# Patient Record
Sex: Female | Born: 1960
Health system: Southern US, Community
[De-identification: ages and names within clinical notes are randomized; demographics above are authoritative.]

## PROBLEM LIST (undated history)

## (undated) DIAGNOSIS — R079 Chest pain, unspecified: Secondary | ICD-10-CM

## (undated) DIAGNOSIS — I10 Essential (primary) hypertension: Secondary | ICD-10-CM

## (undated) DIAGNOSIS — E669 Obesity, unspecified: Secondary | ICD-10-CM

## (undated) DIAGNOSIS — E559 Vitamin D deficiency, unspecified: Secondary | ICD-10-CM

## (undated) DIAGNOSIS — E785 Hyperlipidemia, unspecified: Secondary | ICD-10-CM

## (undated) DIAGNOSIS — G459 Transient cerebral ischemic attack, unspecified: Secondary | ICD-10-CM

## (undated) DIAGNOSIS — M2550 Pain in unspecified joint: Secondary | ICD-10-CM

## (undated) DIAGNOSIS — E119 Type 2 diabetes mellitus without complications: Secondary | ICD-10-CM

## (undated) HISTORY — DX: Obesity, unspecified: E66.9

## (undated) HISTORY — DX: Vitamin D deficiency, unspecified: E55.9

## (undated) HISTORY — DX: Transient cerebral ischemic attack, unspecified: G45.9

## (undated) HISTORY — DX: Pain in unspecified joint: M25.50

## (undated) HISTORY — DX: Chest pain, unspecified: R07.9

## (undated) HISTORY — PX: ABDOMINAL HYSTERECTOMY: SHX81

---

## 1997-08-11 ENCOUNTER — Other Ambulatory Visit: Admission: RE | Admit: 1997-08-11 | Discharge: 1997-08-11 | Payer: Self-pay | Admitting: Obstetrics & Gynecology

## 1998-09-27 ENCOUNTER — Other Ambulatory Visit: Admission: RE | Admit: 1998-09-27 | Discharge: 1998-09-27 | Payer: Self-pay | Admitting: *Deleted

## 2001-07-01 ENCOUNTER — Encounter (HOSPITAL_COMMUNITY): Admission: RE | Admit: 2001-07-01 | Discharge: 2001-09-29 | Payer: Self-pay | Admitting: Family Medicine

## 2001-07-26 ENCOUNTER — Other Ambulatory Visit: Admission: RE | Admit: 2001-07-26 | Discharge: 2001-07-26 | Payer: Self-pay | Admitting: Obstetrics

## 2001-08-04 ENCOUNTER — Ambulatory Visit (HOSPITAL_COMMUNITY): Admission: RE | Admit: 2001-08-04 | Discharge: 2001-08-04 | Payer: Self-pay | Admitting: Obstetrics

## 2001-08-04 ENCOUNTER — Encounter: Payer: Self-pay | Admitting: Obstetrics

## 2001-09-07 ENCOUNTER — Encounter (INDEPENDENT_AMBULATORY_CARE_PROVIDER_SITE_OTHER): Payer: Self-pay

## 2001-09-07 ENCOUNTER — Inpatient Hospital Stay (HOSPITAL_COMMUNITY): Admission: RE | Admit: 2001-09-07 | Discharge: 2001-09-10 | Payer: Self-pay | Admitting: Obstetrics

## 2002-11-08 ENCOUNTER — Ambulatory Visit (HOSPITAL_COMMUNITY): Admission: RE | Admit: 2002-11-08 | Discharge: 2002-11-08 | Payer: Self-pay | Admitting: Obstetrics

## 2002-11-08 ENCOUNTER — Encounter: Payer: Self-pay | Admitting: Obstetrics

## 2003-05-16 ENCOUNTER — Encounter: Admission: RE | Admit: 2003-05-16 | Discharge: 2003-08-14 | Payer: Self-pay | Admitting: Family Medicine

## 2003-05-26 ENCOUNTER — Observation Stay (HOSPITAL_COMMUNITY): Admission: EM | Admit: 2003-05-26 | Discharge: 2003-05-27 | Payer: Self-pay | Admitting: Emergency Medicine

## 2005-10-19 ENCOUNTER — Emergency Department (HOSPITAL_COMMUNITY): Admission: EM | Admit: 2005-10-19 | Discharge: 2005-10-20 | Payer: Self-pay | Admitting: Emergency Medicine

## 2005-10-31 ENCOUNTER — Ambulatory Visit (HOSPITAL_COMMUNITY): Admission: RE | Admit: 2005-10-31 | Discharge: 2005-10-31 | Payer: Self-pay | Admitting: Obstetrics

## 2005-11-03 ENCOUNTER — Ambulatory Visit (HOSPITAL_COMMUNITY): Admission: RE | Admit: 2005-11-03 | Discharge: 2005-11-03 | Payer: Self-pay | Admitting: Obstetrics

## 2006-08-04 ENCOUNTER — Emergency Department (HOSPITAL_COMMUNITY): Admission: EM | Admit: 2006-08-04 | Discharge: 2006-08-05 | Payer: Self-pay | Admitting: Emergency Medicine

## 2006-12-11 ENCOUNTER — Ambulatory Visit (HOSPITAL_COMMUNITY): Admission: RE | Admit: 2006-12-11 | Discharge: 2006-12-11 | Payer: Self-pay | Admitting: Obstetrics

## 2007-12-22 ENCOUNTER — Ambulatory Visit (HOSPITAL_COMMUNITY): Admission: RE | Admit: 2007-12-22 | Discharge: 2007-12-22 | Payer: Self-pay | Admitting: Family Medicine

## 2008-02-14 ENCOUNTER — Emergency Department (HOSPITAL_COMMUNITY): Admission: EM | Admit: 2008-02-14 | Discharge: 2008-02-14 | Payer: Self-pay | Admitting: Emergency Medicine

## 2008-10-23 ENCOUNTER — Encounter: Admission: RE | Admit: 2008-10-23 | Discharge: 2008-10-23 | Payer: Self-pay | Admitting: Family Medicine

## 2009-03-01 ENCOUNTER — Ambulatory Visit (HOSPITAL_COMMUNITY): Admission: RE | Admit: 2009-03-01 | Discharge: 2009-03-01 | Payer: Self-pay | Admitting: Family Medicine

## 2010-04-03 ENCOUNTER — Ambulatory Visit (HOSPITAL_COMMUNITY)
Admission: RE | Admit: 2010-04-03 | Discharge: 2010-04-03 | Payer: Self-pay | Source: Home / Self Care | Attending: Family Medicine | Admitting: Family Medicine

## 2010-08-30 NOTE — H&P (Signed)
NAMEJONICA, BICKHART                            ACCOUNT NO.:  [id number]   MEDICAL RECORD NO.:  [id number]                   PATIENT TYPE:  EMS   LOCATION:  ED                                   FACILITY:  [facility]   PHYSICIAN:  Hettie Holstein, D.O.                 DATE OF BIRTH:  1960-07-17   DATE OF ADMISSION:  05/26/2003  DATE OF DISCHARGE:                                HISTORY & PHYSICAL   PRIMARY CARE PHYSICIAN:  Renaye Rakers, M.D.   CHIEF COMPLAINT:  Chest pain.   HISTORY OF PRESENT ILLNESS:  This is a 50 year old, diabetic, obese female  with hypertension and hyperlipidemia, who presents with on and off chest  pain for about a week, though today accompanied with left arm numbness and  tingling and shortness of breath.  She states that this pain has been  intermittent, not too severe, and she describes it as sharp in nature, left  parasternal, lasting anywhere from 2 minutes and more in duration and not  associated with activity or deep breathing or heavy exertion.  She states  that she has had an accompanying shortness of breath, denies any jaw pain or  diaphoresis.  No nausea, vomiting, or diarrhea.  No prior history of  coronary artery disease or risk stratification studies in the past.  She has  recently been diagnosed with diabetes by her primary care physician in  December but has been noncompliant with Crestor for her hyperlipidemia  secondary to concerns for liver toxicity.  She is on no medications for  diabetes.  She had been on Avandia; however, is not taking it.  Otherwise,  medical history is unremarkable.  Surgical history is significant for  hysterectomy for uterine fibroids in May 2003 without oophorectomy.  No  other surgeries are reported.   MEDICATIONS:  1. Diovan 160 mg p.o. daily.  2. Dynacirc10 mg 1 p.o. daily.  3. Has been prescribed Crestor but does not take this medication due to     concerns.   ALLERGIES:  No known drug allergies.   SOCIAL HISTORY:   The patient denies tobacco or alcohol.  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   FAMILY HISTORY:  Mother is 75, father is in his 48s.  Both are alive and  well.  She had a brother who died with leukemia.  Otherwise, all of her  siblings are healthy.   REVIEW OF SYSTEMS:  No fever, chills, or night sweats.  No weight loss.  No  anorexia.  No nausea, vomiting, diarrhea.  Chest pain is described above.  No productive cough.  No __________.  No abdominal pain.  No diarrhea and no  constipation.  No hematemesis, hematochezia, melena.  No dyspnea on  exertion, though the patient is sedentary.   PHYSICAL EXAMINATION:  VITAL SIGNS:  Blood pressure 142/65, heart rate 80,  respirations  20, temperature 97.8.  GENERAL:  This is an obese African-American female, under no apparent  distress, ambulating without problems.  HEENT:  NCAT.  EOMI.  Pupils equally reactive to light.  No jaundice.  No  pallor.  NECK:  Supple.  Nontender.  No palpable thyromegaly.  HEART:  Sounds are distant, normal S1 and S2 without appreciable murmur.  LUNGS:  Clear bilaterally.  ABDOMEN:  Obese.  No palpable hepatosplenomegaly.  Bowel sounds are  normoactive.  LOWER EXTREMITIES:  Nonedematous.  Peripheral pulses are palpable  bilaterally.  There is no calf tenderness.   LABORATORY DATA:  The patient's WBC 5.9, hemoglobin 12.9, hematocrit 38.0,  MCV 80, platelets 324.  Sodium 137, potassium 2.8, chloride 103, pO2 30,  glucose 110, BUN 13, creatinine 0.8, calcium 10.2, total protein 10.5,  albumin 4.2, AST 24, ALT 18, bilirubin 1.1.  Initial set of cardiac enzymes:  CK 167, MB 13, troponin I 0.01.  EKG revealed normal sinus rhythm with  diffuse T-wave inversions in the inferolateral and anterior leads.  There  was no ST segment elevation or depression.  Chest x-ray is not available for  my view at this time.   IMPRESSION AND PLAN:  1. Chest pain, rule out ischemia.  2. Hypercholesterolemia.  3.  Diabetes mellitus.  4. Hypertension.  5. Obesity.  6. Hypercholesterolemia.  7. Abnormal EKG.   PLAN:  At this time, we are going to admit Ms. Muffley to the telemetry floor.  Discuss with [facility] that outpatient stress test could be  performed if she rules out for acute ischemic event this admission.  We are  going to __________ her with fasting lipid profile.  We will check a  hemoglobin A1c to assess her control of diabetes, continue her home  medications at this time.  Start her on low-dose beta blocker and continue  her Diovan, start her on aspirin, oxygen as needed, nitroglycerin p.r.n.,  continue her on her medications at home and add deep venous thrombosis  prophylaxis and follow enzymes, rule out acute ischemic event.                                               Hettie Holstein, D.O.    ESS/MEDQ  D:  05/26/2003  T:  05/26/2003  Job:  [id number]   cc:   Renaye Rakers, M.D.  [phone] [address], [address]  [place]  [place] [address]  Fax: [phone]

## 2010-08-30 NOTE — Discharge Summary (Signed)
[facility]  Patient:    [name], [name] Visit Number: [id number] MRN: [id number]          Service Type: GYN Location: 910A 9114 01 Attending Physician:  Tammi Sou Dictated by:   Bing Neighbors Clearance Coots, M.D. Admit Date:  09/07/2001 Discharge Date: 09/10/2001                             Discharge Summary  ADMISSION DIAGNOSES:          Symptomatic uterine fibroids.  DISCHARGE DIAGNOSIS:          Symptomatic uterine fibroids, improved status post total abdominal hysterectomy.  CONDITION ON DISCHARGE:       Discharged home in good condition.  REASON FOR ADMISSION:         A 50 year old black female with a history of uterine fibroids, pain and heavy bleeding had been getting progressively worse over the past year.  The patient had tried medical therapy, but failed medical therapy.  She had developed a severe anemia due to the heavy bleeding with her uterine fibroids which required three units of transfusion of packed red blood cells in March of 2003.  The patient has been on hemotenic therapy since then and now has a hemoglobin of 11.  The patient strongly desires definitive surgical management.  Risks, complications, and benefits of total abdominal hysterectomy were explained to the patient and she agreed to the procedure.  PAST MEDICAL HISTORY:         Surgery; none.  Illnesses; none.  MEDICATIONS:                  Iron.  ALLERGIES:                    No known drug allergies.  FAMILY HISTORY:               Noncontributory.  SOCIAL HISTORY:               Single, works at [employer].  Negative history of tobacco, alcohol, or recreational drug use.  FAMILY HISTORY:               Noncontributory.  PHYSICAL EXAMINATION:         GENERAL: Obese black female in no acute distress.  VITAL SIGNS: Temperature 97.7, pulse 73, respiratory rate 16, blood pressure 173/103, repeat blood pressure 136/94.  HEENT: Benign.   LUNGS: Clear to auscultation bilaterally.  CHEST: Regular rate and rhythm without murmurs, rubs, or gallops.  BREASTS: Soft without masses, tenderness, or nipple discharge.  ABDOMEN: Obese, soft, palpable uterine fibroids at the umbilicus. Irregular contour.  Tender.  PELVIC: Normal external female genitalia. Vaginal mucosa normal.  Cervix parous without lesions, discharge, or bleeding. Uterus approximately 20 weeks size, irregular contour, tender.  The adnexa could not be appreciated.  EXTREMITIES: Without clubbing, cyanosis, or edema bilaterally.  SKIN: Normal.  LABORATORY DATA:              Hemoglobin 11.2, hematocrit 35.0, white blood cell count 5400, and platelets 329,000.  Comprehensive metabolic panel was within normal limits.  Urinalysis was within normal limits.  Urine pregnancy test was negative.  IMPRESSION:                   Symptomatic uterine fibroids, failed medical management, desires definitive surgical management.  PLAN:  Total abdominal hysterectomy.  HOSPITAL COURSE:              The patient underwent a total abdominal hysterectomy on Sep 07, 2001.  There were no intraoperative complications. The patients postoperative course was uncomplicated.  She was discharged home on postoperative day #3 in good condition.  DISCHARGE MEDICATIONS:        1. Tylox one to two tablets every three to four                                  hours as needed for pain.                               2. Ibuprofen 600 mg p.o. every six hours                                  supplementally as needed for pain.                               3. Hemocyte Plus one p.o. q.d.  Routine written instructions were given for diet, activity, and wound care.  FOLLOW-UP:                    The patient is to follow up in the office in four days for removal of staples.  DISCHARGE LABORATORY DATA:    Hemoglobin 8.9, hematocrit 27.5, white blood cell count 8800, and platelets  258,000. Dictated by:   Bing Neighbors Clearance Coots, M.D. Attending Physician:  Tammi Sou DD:  09/09/01 TD:  09/10/01 Job: [id number]

## 2010-08-30 NOTE — Discharge Summary (Signed)
[name], [name]                            ACCOUNT NO.:  [id number]   MEDICAL RECORD NO.:  [id number]                   PATIENT TYPE:  INP   LOCATION:  0381                                 FACILITY:  [facility]   PHYSICIAN:  Hettie Holstein, D.O.                 DATE OF BIRTH:  October 05, 1960   DATE OF ADMISSION:  05/26/2003  DATE OF DISCHARGE:  05/27/2003                                 DISCHARGE SUMMARY   ADDENDUM:  To Job Number [id number]   LABORATORY DATA:  The patient's hemoglobin A1C was 5.9.  Lipid profile  revealed a total cholesterol of 221, triglycerides 98, LDL 160, and HDL of  41.                                               Hettie Holstein, D.O.    ESS/MEDQ  D:  05/28/2003  T:  05/28/2003  Job:  [id number]   cc:   Renaye Rakers, M.D.  [phone] [address], [address]  [place]  [place] [address]  Fax: [phone]

## 2010-08-30 NOTE — Op Note (Signed)
[facility]  Patient:    [name], DONE Visit Number: [id number] MRN: [id number]          Service Type: GYN Location: 910A 9114 01 Attending Physician:  Tammi Sou Dictated by:   Bing Neighbors Clearance Coots, M.D. Proc. Date: 09/07/01 Admit Date:  09/07/2001                             Operative Report  PREOPERATIVE DIAGNOSES:       Symptomatic uterine fibroids.  POSTOPERATIVE DIAGNOSES:      Symptomatic uterine fibroids.  PROCEDURE:                    Total abdominal hysterectomy.  SURGEON:                      Charles A. Clearance Coots, M.D., Roseanna Rainbow,                               M.D.  ANESTHESIA:                   General.  ESTIMATED BLOOD LOSS:         200 ml.  INTRAVENOUS FLUIDS:           2500 ml.  URINE OUT:                    200 ml clear.  COMPLICATIONS:                None.  DRAINS:                       Foley to gravity.  SPECIMEN:                     Uterus with cervix.  FINDINGS:                     Large uterus with multiple fibroids.  Ovaries and fallopian tubes normal.  OPERATION:                    Patient was brought to the operating room and after satisfactory general endotracheal anesthesia the abdomen was prepped and draped in the usual sterile fashion.  A vertical skin incision was made starting a few centimeters below the umbilicus and extending down to the symphysis.  The incision was deepened down to the fascia with a scalpel.  The fascia was nicked in the midline and the fascia incision was extended superiorly and inferiorly.  The rectus muscle was sharply dissected in the midline off of the rectus fascia and the peritoneum was grasped with hemostats superior aspect adjacent to the umbilicus and was incised with Metzenbaum scissors.  Peritoneal incision was then extended superiorly and inferiorly with the Metzenbaum scissors being careful to avoid the urinary bladder inferiorly.  The uterus was  then palpated and was noted to be larger than anticipated and the skin incision was then extended superiorly in a paraumbilical fashion.  The incision was deepened down to the fascia with a scalpel and the fascia was also extended superiorly in a paraumbilical fashion.  Exposure was then much improved and the uterus was exteriorized into the incision.  Kelly forceps were then placed across the utero-ovarian ligament and round ligaments bilaterally.  The round  ligaments were then cauterized bilaterally and the vesicouterine fold of peritoneum was dissected sharply with Metzenbaum scissors away from the anterior lower uterine segment and cervix.  The urinary bladder was easily pushed downward away from the operative field.  A small window was then bluntly made in the broad ligament on the left side and the long Kelly forceps were then extended down through the window and the broad ligament across the utero-ovarian and round ligaments.  A long curved parametrial clamp was then placed beneath the Kelly clamp across the utero-ovarian and broad ligament pedicles.  The pedicle was then cut and free tie was placed beneath the clamp using 0 Vicryl.  A second transfixion suture of 0 Vicryl was placed above the free tie.  Hemostasis was excellent.  The same procedure was performed on the opposite side without complications.  The uterine artery branches were then skeletonized bilaterally and were doubly clamped and transected and suture ligated with transfixion sutures of 0 Vicryl.  The large bulky uterus was then excised at the uterocervical junction and exposure was much improved.  A OConnor-O-Sullivan retractor was then placed in the incision after packing off the bowel.  The cervical stump was then grasped with Kocher forceps and the urinary bladder was again advanced downward away from the operative field. The cardinal ligaments were then serially clamped and suture ligated bilaterally  with transfixion sutures of 0 Vicryl down to the uterosacral ligaments.  The uterosacral ligaments were then clamped with curved parametrial clamp across the vaginal cuff and suture ligated bilaterally with transfixion sutures of 0 Vicryl.  Curved parametrial clamps were then placed across the vaginal cuff meeting each other on both sides and the cervix was then excised with curved Mayo scissors.  The vaginal cuff was then closed with two transfixion sutures of 0 Vicryl in each corner and figure-of-eight suture of 0 Vicryl in the middle of the vaginal cuff.  Hemostasis was excellent.  The pelvic cavity was then thoroughly irrigated with warm saline solution.  All pedicles were reexamined for hemostasis and there was no active bleeding noted.  All abdominal packing was then removed and the OConnor-O-Sullivan retractor was removed.  The surgical technician indicated that all sponge counts were correct.  The abdomen was then closed as follows.  The fascia was closed with a continuous suture of 0 PDS from each corner to the center.  Subcutaneous tissue was thoroughly irrigated with warm saline solution.  All areas of subcutaneous bleeding were coagulated with the Bovie.  The subcutaneous tissue was then approximated with a continuous suture of 0 plain catgut.  The skin was approximated with surgical stainless steel staples.  A sterile pressure bandage was applied to the incision closure.  Surgical technician indicated that all needle, sponge, and instrument counts were correct.  The patient tolerated the procedure well and was transported to the recovery room in satisfactory condition. Dictated by:   Bing Neighbors Clearance Coots, M.D. Attending Physician:  Tammi Sou DD:  09/07/01 TD:  09/08/01 Job: [id number]

## 2010-08-30 NOTE — Discharge Summary (Signed)
[name], [name]                            ACCOUNT NO.:  [id number]   MEDICAL RECORD NO.:  [id number]                   PATIENT TYPE:  INP   LOCATION:  0381                                 [facility]ACILITY[facility]   PHYSICIAN:  Hettie Holstein, D.O.                 DATE OF BIRTH:  10/13/1960   DATE OF ADMISSION:  05/26/2003  DATE OF DISCHARGE:  05/27/2003                                 DISCHARGE SUMMARY   PRIMARY CARE PHYSICIAN:  Renaye Rakers, M.D.   ADMISSION DIAGNOSIS:  Chest pain.   DISCHARGE DIAGNOSIS:  1. Chest pain, no evidence of an acute ischemic event by cardiac enzymes.     EKG with T-wave inversions.  The patient referred for outpatient stress     test on the following Monday, the 14th of February, at 9 a.m. with     [facility].  2. Hypokalemia.  3. Obesity.  4. Diabetes mellitus.  5. Hypercholesterolemia.  6. Hypertension.   HISTORY OF PRESENTING ILLNESS:  This is a 50 year old female, diabetic,  obese, with hypertension, and hyperlipidemia who presented with on and off  chest pain for about a week though today she complained of some left arm  numbness, tingling, and shortness of breath without diaphoresis not related  to exertion.  She stated the pain lasted no longer than 2 minutes.  She says  it is sharp in nature, nonpleuritic, and not associated with activity or  deep breathing.  She stated that she had no nausea, vomiting, or diarrhea,  no prior history of coronary artery disease, no risk stratification studies  in the past.  She has recently been diagnosed with diabetes by her primary  care physician in December but has been noncompliant with Crestor for her  hyperlipidemia.  She had been on Avandia; however, is not taking it.  Otherwise medical history unremarkable.   HOSPITAL COURSE:  The patient was admitted to the telemetry floor, cardiac  enzymes were cycled without evidence of acute ischemic event.  EKG remained  unchanged.  The patient remained  chest pain-free.  Studies ordered that are  not back at time of discharge included a hemoglobin A1C and lipid profile.  These will be available to Dr. Renaye Rakers once they are completed.  They  will be included in an addendum sent to Dr. Parke Simmers.  Alternatively, the  office can contact the [facility] for these results.   LABORATORY DATA:  Included thyroid-stimulating hormone at 1.889, troponin  0.01.  On day of discharge lipid profile pending, hemoglobin pending.  EKG  revealed normal sinus rhythm with diffuse T-wave inversions.  Potassium was  2.9 on admission.  The patient was replaced.  Recommend oral supplementation  and follow up potassium in a week with her primary care physician.  Hettie Holstein, D.O.    ESS/MEDQ  D:  05/27/2003  T:  05/27/2003  Job:  [id number]   cc:   Renaye Rakers, M.D.  [phone] [address], [address]  [place]  [place] [address]  Fax: [phone]

## 2010-12-28 ENCOUNTER — Inpatient Hospital Stay (HOSPITAL_COMMUNITY)
Admission: EM | Admit: 2010-12-28 | Discharge: 2010-12-31 | DRG: 143 | Disposition: A | Discharge status: Home / Self Care | Payer: BC Managed Care – PPO | Attending: Internal Medicine | Admitting: Internal Medicine

## 2010-12-28 ENCOUNTER — Emergency Department (HOSPITAL_COMMUNITY): Payer: BC Managed Care – PPO

## 2010-12-28 DIAGNOSIS — IMO0001 Reserved for inherently not codable concepts without codable children: Secondary | ICD-10-CM | POA: Diagnosis present

## 2010-12-28 DIAGNOSIS — R0602 Shortness of breath: Secondary | ICD-10-CM | POA: Diagnosis present

## 2010-12-28 DIAGNOSIS — Z8673 Personal history of transient ischemic attack (TIA), and cerebral infarction without residual deficits: Secondary | ICD-10-CM

## 2010-12-28 DIAGNOSIS — E785 Hyperlipidemia, unspecified: Secondary | ICD-10-CM | POA: Diagnosis present

## 2010-12-28 DIAGNOSIS — I10 Essential (primary) hypertension: Secondary | ICD-10-CM | POA: Diagnosis present

## 2010-12-28 DIAGNOSIS — E876 Hypokalemia: Secondary | ICD-10-CM | POA: Diagnosis present

## 2010-12-28 DIAGNOSIS — R209 Unspecified disturbances of skin sensation: Secondary | ICD-10-CM | POA: Diagnosis present

## 2010-12-28 DIAGNOSIS — R079 Chest pain, unspecified: Principal | ICD-10-CM | POA: Diagnosis present

## 2010-12-28 DIAGNOSIS — Z7982 Long term (current) use of aspirin: Secondary | ICD-10-CM

## 2010-12-28 LAB — DIFFERENTIAL
Basophils Absolute: 0 10*3/uL (ref 0.0–0.1)
Basophils Relative: 0 % (ref 0–1)
Eosinophils Absolute: 0.1 10*3/uL (ref 0.0–0.7)
Eosinophils Relative: 1 % (ref 0–5)
Lymphocytes Relative: 50 % — ABNORMAL HIGH (ref 12–46)
Lymphs Abs: 3 10*3/uL (ref 0.7–4.0)
Monocytes Absolute: 0.4 10*3/uL (ref 0.1–1.0)
Monocytes Relative: 7 % (ref 3–12)
Neutro Abs: 2.6 10*3/uL (ref 1.7–7.7)
Neutrophils Relative %: 42 % — ABNORMAL LOW (ref 43–77)

## 2010-12-28 LAB — LIPASE, BLOOD: Lipase: 20 U/L (ref 11–59)

## 2010-12-28 LAB — COMPREHENSIVE METABOLIC PANEL
ALT: 23 U/L (ref 0–35)
AST: 22 U/L (ref 0–37)
Albumin: 4 g/dL (ref 3.5–5.2)
Alkaline Phosphatase: 79 U/L (ref 39–117)
BUN: 12 mg/dL (ref 6–23)
CO2: 27 mEq/L (ref 19–32)
Calcium: 9.4 mg/dL (ref 8.4–10.5)
Chloride: 99 mEq/L (ref 96–112)
Creatinine, Ser: 0.53 mg/dL (ref 0.50–1.10)
GFR calc Af Amer: >60 mL/min (ref >60)
GFR calc non Af Amer: >60 mL/min (ref >60)
Glucose, Bld: 278 mg/dL — ABNORMAL HIGH (ref 70–99)
Potassium: 3.2 mEq/L — ABNORMAL LOW (ref 3.5–5.1)
Sodium: 137 mEq/L (ref 135–145)
Total Bilirubin: 0.6 mg/dL (ref 0.3–1.2)
Total Protein: 7.3 g/dL (ref 6.0–8.3)

## 2010-12-28 LAB — CBC
HCT: 36.1 % (ref 36.0–46.0)
Hemoglobin: 12.7 g/dL (ref 12.0–15.0)
MCH: 28.5 pg (ref 26.0–34.0)
MCHC: 35.2 g/dL (ref 30.0–36.0)
MCV: 80.9 fL (ref 78.0–100.0)
Platelets: 280 10*3/uL (ref 150–400)
RBC: 4.46 MIL/uL (ref 3.87–5.11)
RDW: 13.3 % (ref 11.5–15.5)
WBC: 6.1 10*3/uL (ref 4.0–10.5)

## 2010-12-28 LAB — POCT I-STAT TROPONIN I: Troponin i, poc: 0 ng/mL (ref 0.00–0.08)

## 2010-12-29 DIAGNOSIS — R0789 Other chest pain: Secondary | ICD-10-CM

## 2010-12-29 LAB — CARDIAC PANEL(CRET KIN+CKTOT+MB+TROPI)
CK, MB: 2.5 ng/mL (ref 0.3–4.0)
CK, MB: 2 ng/mL (ref 0.3–4.0)
Relative Index: 1.2 (ref 0.0–2.5)
Relative Index: 1 (ref 0.0–2.5)
Total CK: 211 U/L — ABNORMAL HIGH (ref 7–177)
Total CK: 196 U/L — ABNORMAL HIGH (ref 7–177)
Troponin I: <0.3 ng/mL (ref <0.30)
Troponin I: <0.3 ng/mL (ref <0.30)

## 2010-12-29 LAB — MRSA PCR SCREENING: MRSA by PCR: NEGATIVE

## 2010-12-29 LAB — CK TOTAL AND CKMB (NOT AT ARMC)
CK, MB: 3.1 ng/mL (ref 0.3–4.0)
Relative Index: 1 (ref 0.0–2.5)
Total CK: 320 U/L — ABNORMAL HIGH (ref 7–177)

## 2010-12-29 LAB — GLUCOSE, CAPILLARY
Glucose-Capillary: 309 mg/dL — ABNORMAL HIGH (ref 70–99)
Glucose-Capillary: 265 mg/dL — ABNORMAL HIGH (ref 70–99)
Glucose-Capillary: 258 mg/dL — ABNORMAL HIGH (ref 70–99)
Glucose-Capillary: 215 mg/dL — ABNORMAL HIGH (ref 70–99)
Glucose-Capillary: 191 mg/dL — ABNORMAL HIGH (ref 70–99)

## 2010-12-29 LAB — LIPID PANEL
Cholesterol: 236 mg/dL — ABNORMAL HIGH (ref 0–200)
Cholesterol: 198 mg/dL (ref 0–200)
HDL: 51 mg/dL (ref >39)
HDL: 41 mg/dL (ref >39)
LDL Cholesterol: 160 mg/dL — ABNORMAL HIGH (ref 0–99)
LDL Cholesterol: 120 mg/dL — ABNORMAL HIGH (ref 0–99)
Total CHOL/HDL Ratio: 4.6 RATIO
Total CHOL/HDL Ratio: 4.8 RATIO
Triglycerides: 124 mg/dL (ref <150)
Triglycerides: 187 mg/dL — ABNORMAL HIGH (ref <150)
VLDL: 25 mg/dL (ref 0–40)
VLDL: 37 mg/dL (ref 0–40)

## 2010-12-29 LAB — APTT: aPTT: 31 seconds (ref 24–37)

## 2010-12-29 LAB — HEMOGLOBIN A1C
Hgb A1c MFr Bld: 11.3 % — ABNORMAL HIGH (ref <5.7)
Mean Plasma Glucose: 278 mg/dL — ABNORMAL HIGH (ref <117)

## 2010-12-29 LAB — PROTIME-INR
INR: 0.95 (ref 0.00–1.49)
Prothrombin Time: 12.9 seconds (ref 11.6–15.2)

## 2010-12-29 LAB — HEPARIN LEVEL (UNFRACTIONATED): Heparin Unfractionated: 0.23 IU/mL — ABNORMAL LOW (ref 0.30–0.70)

## 2010-12-29 LAB — TROPONIN I: Troponin I: <0.3 ng/mL (ref <0.30)

## 2010-12-29 LAB — TSH: TSH: 1.253 u[IU]/mL (ref 0.350–4.500)

## 2010-12-29 NOTE — H&P (Signed)
[name], [name] NO.:  [id number]  MEDICAL RECORD NO.:  [id number]  LOCATION:  WLED                         FACILITY:  [facility]  PHYSICIAN:  Della Goo, M.D. DATE OF BIRTH:  04-Dec-1960  DATE OF ADMISSION:  12/29/2010 DATE OF DISCHARGE:                             HISTORY & PHYSICAL   DATE OF ADMISSION:  December 29, 2010.  PRIMARY CARE PHYSICIAN:  Renaye Rakers, M.D.  CHIEF COMPLAINT:  Chest pain.  HISTORY OF PRESENT ILLNESS:  This is a 50 year old female with a history of diabetes and hypertension, who was working this evening and had worsening chest pain.  She reports having chest pain off and on for the past 3 days.  The pain has been pressure like and in the mid chest, radiating into her left arm.  She states that the pain was worse this evening.  The pain has been occurring with and without exertion.  She rated the pain as being 4 to 6 out of 10.  She reports that her arm has also had a numbness feeling all day as well.  She reports having shortness of breath and diaphoresis associated with the episodes.  She denies having any nausea or vomiting.  PAST MEDICAL HISTORY:  Significant for: 1. Hypertension. 2. Dyslipidemia. 3. Type 2, diabetes mellitus. 4. History of transient ischemic attacks.  PAST SURGICAL HISTORY:  History of a total abdominal hysterectomy secondary to uterine fibroids in 2003.  MEDICATIONS:  Her medications include metformin, aspirin, Crestor, Exforge, and Onglyza.  ALLERGIES:  NO KNOWN DRUG ALLERGIES.  SOCIAL HISTORY:  The patient is married, nonsmoker, and nondrinker.  No history of illicit drug usage.  FAMILY HISTORY:  Positive for hypertension in both parents.  Positive cancer in her brother, who died of leukemia.  No coronary artery disease in her family.  No diabetic disease in her family.  REVIEW OF SYSTEMS:  Pertinents as mentioned above.  PHYSICAL EXAMINATION FINDINGS:  GENERAL:  This is a 50 year old  morbidly obese African American female, who is in discomfort, but in no acute distress. VITAL SIGNS:  Temperature of 98.8; blood pressure initially 171/104, now 150/92; heart rate 72; respirations 16; O2 saturation is 100%. HEENT EXAMINATION:  Normocephalic, atraumatic.  Pupils are equally round and reactive to light.  Extraocular movements are intact.  Funduscopic benign.  There is no scleral icterus.  Nares are patent bilaterally. Oropharynx is clear. NECK:  Supple.  Full range of motion.  No thyromegaly, adenopathy, or jugular venous distention. CARDIOVASCULAR:  Regular rate and rhythm.  Normal S1, S2.  No murmurs, gallops, or rubs appreciated. CHEST WALL:  Nontender to palpation.  There are no unusual masses. Chest wall excursion is symmetric, and breathing is unlabored. LUNGS:  Clear to auscultation bilaterally.  No rales, rhonchi, or wheezes. ABDOMEN:  Positive bowel sounds.  Soft, nontender, nondistended.  No hepatosplenomegaly. EXTREMITIES:  Without cyanosis, clubbing, or edema. NEUROLOGIC EXAMINATION:  The patient is alert and oriented x3.  Cranial nerves are intact.  There are no focal deficits on examination.  LABORATORY STUDIES:  White blood cell count 6.1, hemoglobin 12.7, hematocrit 36.1, MCV 80.9, platelets 280, neutrophils 42%, lymphocytes 50%.  Sodium 137, potassium 3.2, chloride 99,  CO2 of 27, BUN 12, creatinine 0.53, and glucose 278.  Albumin 4.0, AST 22, ALT 22, lipase 20.  Chest x-ray reveals no acute cardiopulmonary disease process.  EKG reveals T-wave inversions which are seen in the inferior and the anterior leads.  There is no ST elevation seen.  Previous EKGs do reveal some T-wave inversion in the inferior leads.  Cardiac enzymes, first set have been negative.  Total CK 320, CK-MB 3.1, relative index 1.0, and a troponin of less than 0.30.  ASSESSMENT:  A 50 year old female being admitted with: 1. Chest pain. 2. Shortness of breath. 3. Hypertension. 4.  Type 2, diabetes mellitus. 5. Mild hypokalemia. 6. Hyperglycemia. 7. Morbid obesity. 8. Dyslipidemia.  PLAN:  The patient will be admitted to the step-down ICU area.  She has been placed on an IV nitroglycerin drip and an IV heparin drip, supplemental oxygen, and aspirin therapies.  Cardiac enzymes will continue to be performed.  The patient has also been placed on sliding scale insulin coverage and a carb-modified medium diet.  Her regular medications will be further reconciled, and Cardiology has been consulted to see this patient.  Please note that the cardiologist on- call was Dr. Armanda Magic, covering for [facility].  The patient reports many years ago having a stress test performed by [facility].  Unable to find the report in the medical records currently. The patient is a full code.     Della Goo, M.D.     HJ/MEDQ  D:  12/29/2010  T:  12/29/2010  Job:  [id number]  cc:   Renaye Rakers, M.D. Fax: [phone]  Electronically Signed by Della Goo M.D. on 12/29/2010 09:59:55 PM

## 2010-12-30 DIAGNOSIS — I369 Nonrheumatic tricuspid valve disorder, unspecified: Secondary | ICD-10-CM

## 2010-12-30 LAB — BASIC METABOLIC PANEL
BUN: 12 mg/dL (ref 6–23)
CO2: 25 mEq/L (ref 19–32)
Calcium: 9.1 mg/dL (ref 8.4–10.5)
Chloride: 102 mEq/L (ref 96–112)
Creatinine, Ser: 0.55 mg/dL (ref 0.50–1.10)
GFR calc Af Amer: >60 mL/min (ref >60)
GFR calc non Af Amer: >60 mL/min (ref >60)
Glucose, Bld: 246 mg/dL — ABNORMAL HIGH (ref 70–99)
Potassium: 3.6 mEq/L (ref 3.5–5.1)
Sodium: 136 mEq/L (ref 135–145)

## 2010-12-30 LAB — CBC
HCT: 33.8 % — ABNORMAL LOW (ref 36.0–46.0)
Hemoglobin: 11.5 g/dL — ABNORMAL LOW (ref 12.0–15.0)
MCH: 27.6 pg (ref 26.0–34.0)
MCHC: 34 g/dL (ref 30.0–36.0)
MCV: 81.1 fL (ref 78.0–100.0)
Platelets: 262 10*3/uL (ref 150–400)
RBC: 4.17 MIL/uL (ref 3.87–5.11)
RDW: 13.3 % (ref 11.5–15.5)
WBC: 4.9 10*3/uL (ref 4.0–10.5)

## 2010-12-30 LAB — GLUCOSE, CAPILLARY
Glucose-Capillary: 246 mg/dL — ABNORMAL HIGH (ref 70–99)
Glucose-Capillary: 249 mg/dL — ABNORMAL HIGH (ref 70–99)
Glucose-Capillary: 200 mg/dL — ABNORMAL HIGH (ref 70–99)
Glucose-Capillary: 163 mg/dL — ABNORMAL HIGH (ref 70–99)

## 2010-12-30 LAB — D-DIMER, QUANTITATIVE: D-Dimer, Quant: 0.25 ug/mL-FEU (ref 0.00–0.48)

## 2010-12-31 ENCOUNTER — Ambulatory Visit (HOSPITAL_COMMUNITY): Payer: BC Managed Care – PPO

## 2010-12-31 LAB — CBC
HCT: 35 % — ABNORMAL LOW (ref 36.0–46.0)
Hemoglobin: 11.9 g/dL — ABNORMAL LOW (ref 12.0–15.0)
MCH: 27.6 pg (ref 26.0–34.0)
MCHC: 34 g/dL (ref 30.0–36.0)
MCV: 81.2 fL (ref 78.0–100.0)
Platelets: 267 10*3/uL (ref 150–400)
RBC: 4.31 MIL/uL (ref 3.87–5.11)
RDW: 13.2 % (ref 11.5–15.5)
WBC: 4.9 10*3/uL (ref 4.0–10.5)

## 2010-12-31 LAB — GLUCOSE, CAPILLARY
Glucose-Capillary: 207 mg/dL — ABNORMAL HIGH (ref 70–99)
Glucose-Capillary: 264 mg/dL — ABNORMAL HIGH (ref 70–99)

## 2010-12-31 LAB — BASIC METABOLIC PANEL
BUN: 10 mg/dL (ref 6–23)
CO2: 23 mEq/L (ref 19–32)
Calcium: 8.8 mg/dL (ref 8.4–10.5)
Chloride: 102 mEq/L (ref 96–112)
Creatinine, Ser: 0.48 mg/dL — ABNORMAL LOW (ref 0.50–1.10)
GFR calc Af Amer: >60 mL/min (ref >60)
GFR calc non Af Amer: >60 mL/min (ref >60)
Glucose, Bld: 208 mg/dL — ABNORMAL HIGH (ref 70–99)
Potassium: 3.5 mEq/L (ref 3.5–5.1)
Sodium: 137 mEq/L (ref 135–145)

## 2010-12-31 MED ORDER — TECHNETIUM TC 99M TETROFOSMIN IV KIT
30.0000 | PACK | Freq: Once | INTRAVENOUS | Status: AC | PRN
  Administered 2010-12-31: 30 via INTRAVENOUS

## 2010-12-31 MED ORDER — TECHNETIUM TC 99M TETROFOSMIN IV KIT
10.0000 | PACK | Freq: Once | INTRAVENOUS | Status: AC | PRN
  Administered 2010-12-31: 10 via INTRAVENOUS

## 2011-01-01 LAB — GLUCOSE, CAPILLARY: Glucose-Capillary: 202 mg/dL — ABNORMAL HIGH (ref 70–99)

## 2011-01-02 NOTE — Discharge Summary (Addendum)
[name], [name]                  ACCOUNT NO.:  [id number]  MEDICAL RECORD NO.:  [id number]  LOCATION:  1401                         FACILITY:  [facility]  PHYSICIAN:  Thad Ranger, MD       DATE OF BIRTH:  [birth year]  DATE OF ADMISSION:  12/28/2010 DATE OF DISCHARGE:  12/31/2010                        DISCHARGE SUMMARY - REFERRING   PRIMARY CARE PROVIDER:  Dr. Renaye Rakers.  DISCHARGE DIAGNOSES: 1. Chest pain, resolved, cardiac versus gastroesophageal reflux     disease versus musculoskeletal. 2. Diabetes type 2, uncontrolled. 3. Hypertension. 4. History of transient ischemic attack.  DISCHARGE MEDICATIONS: 1. Nitroglycerin 0.4 mg sublingually every 5 minutes as needed up to 3     doses for chest pain. 2. Protonix 40 mg p.o. b.i.d. before meals. 3. Aspirin 325 mg p.o. daily. 4. Crestor 20 mg p.o. daily. 5. Exforge HCT 10/320/25 mg 1 tablet p.o. daily. 6. Metformin XR 1000 mg 2 tablets p.o. daily. 7. Onglyza 5 mg 1 tablet p.o. daily. 8. Symbicort 80/4.5 mcg 1 puff b.i.d. as needed for allergies.  DIAGNOSTIC LABORATORY DATA:  WBCs 6.1, hemoglobin 12.7, hematocrit 36.1, platelets 280, neutrophils 42%, and absolute neutrophils 2.6.  Sodium is 137, potassium 3.2, chloride 99, CO2 of 27, BUN 12, creatinine 0.53, glucose 278, and lipase 20.  First set of cardiac enzymes, total CK 320, CK-MB 3.1, and troponin I less than 0.30.  PT is 12.9, INR 0.95, and PTT 31.  MRSA PCR screening is negative.  Second set of cardiac enzymes, total CK 211, CK-MB 2.5, and troponin I less than 0.30.  Lipid profile yields cholesterol 236, triglycerides 124, HDL 51, and LDL 160.  TSH is 1.253 and hemoglobin A1c is 11.3.  Third set of cardiac enzymes yields a total CK of 196, CK-MB of 2.0, and troponin I of less than 0.30.  D- dimer 0.25.  DIAGNOSTIC IMAGING: 1. Chest x-ray done on September 15th, yields no acute cardiopulmonary     process seen. 2. A 2-D echo done on September 17th yields an estimated  ejection     fraction of 60% to 65%.  Systolic function normal.  Features     consistent with pseudonormal left ventricular filling pattern, with     concomitant abnormal relaxation and increased filling pressure, and     grade 2 diastolic dysfunction. 3. Myocardial imaging with SPECT on September 18th, yields no fixed or     reversible defects to suggest inducible ischemia.  Normal left     ventricular wall motion and thickening.  Normal ejection fraction     estimated at 70%..  CONSULTATIONS:  Cardiology.  PROCEDURES:  None.  BRIEF HISTORY:  [name] is a 50 year old female with a history of diabetes and hypertension, who presented to the [facility] ED with a chief complaint of worsening chest pain.  She reported having chest pain off and on for 3 days prior to her presentation.  She described the pain as pressure-like in the mid chest radiating to her left arm.  She states the pain was worse the evening of her presentation and that it had been occurring with and without exertion.  She rated the pain of 4/10  to 6/10.  She also indicated that her left arm had some numbness all day. She also experienced shortness of breath and diaphoresis associated with these episodes, but denied nausea or vomiting.  She was admitted to triad hospitalist service.  HOSPITAL COURSE BY PROBLEM: 1. Chest pain with typical features in a patient with multiple risk     factors.  Question cardiac versus GERD versus musculoskeletal.  The     patient was admitted to the step-down unit on a nitroglycerin drip     and a heparin drip.  She was given O2 and aspirin.  Cardiac enzymes     were cycled as dictated above.  EKG showed some T-wave inversions     in the inferior and anterior leads, but no ST elevation.  Lab work     indicated total CK elevation with normal MB and troponin.  A 2-D     echo was done as indicated above.  Cardiology was consulted and     stress test on an inpatient basis was recommended.   The patient was     also given aspirin, Crestor, and Protonix.  During her     hospitalization, she experienced no further chest pain.  She     remained in sinus rhythm on Telemetry.  Her stress test on the 18th     was negative as dictated above.  She will be discharged to home. 2. Diabetes type 2, uncontrolled.  Dr. Isidoro Donning discussed with the patient     the need for insulin, given her hemoglobin A1c was 11.3.  The     patient refused insulin at this time and opted to continue her oral     hypoglycemics until she has followup with Dr. Parke Simmers.  She has been     instructed to see Dr. Parke Simmers for this follow up in 7 to 10 days. 3. Hypertension, poor control.  The patient is being discharged on her     home medication of Exforge HCT as dictated above.  She will follow     up with Dr. Parke Simmers in 7 to 10 days. 4. History of TIA, continue her aspirin.  PHYSICAL EXAMINATION:  VITAL SIGNS:  Temperature 98.7, blood pressure 153/88, heart rate 63, respiration 18, and saturations 100% on room air. GENERAL:  Awake, alert, well-nourished, and well-hydrated. CV:  Regular rate and rhythm.  No murmur, gallop, or rub. ABDOMEN:  Obese and soft, positive bowel sounds throughout and nontender. RESPIRATORY:  Normal effort.  No rhonchi or wheezes.  FOLLOWUP:  The patient is to see her primary care provider, Dr. Parke Simmers in 7 to 10 days.  ACTIVITY:  Increase activity slowly.  DIET:  Strict 1800-calorie diabetic diet.  DISPOSITION:  The patient is medically stable and ready for discharge.  Time spent on this discharge, 25 minutes.     Cynthia Bender, NP   ______________________________ Thad Ranger, MD    KMB/MEDQ  D:  12/31/2010  T:  12/31/2010  Job:  [id number]  cc:   Renaye Rakers, M.D. Fax: [phone]  Electronically Signed by Toya Smothers  on 01/02/2011 07:56:17 AM Electronically Signed by Henley Blyth  on 01/13/2011 03:07:40 PM

## 2011-01-07 ENCOUNTER — Ambulatory Visit: Payer: BC Managed Care – PPO | Admitting: *Deleted

## 2011-01-14 LAB — GLUCOSE, CAPILLARY: Glucose-Capillary: 152 — ABNORMAL HIGH

## 2011-01-15 NOTE — Consult Note (Signed)
[name], [name]                  ACCOUNT NO.:  [id number]  MEDICAL RECORD NO.:  [id number]  LOCATION:  1401                         FACILITY:  [facility]  PHYSICIAN:  Gerrit Friends. Dietrich Pates, MD, FACCDATE OF BIRTH:  [birth year]  DATE OF CONSULTATION:  12/29/2010 DATE OF DISCHARGE:  12/31/2010                                CONSULTATION   REFERRING PHYSICIAN:  Della Goo, M.D.  PRIMARY CARE PHYSICIAN:  Renaye Rakers, M.D.  HPI:  Cardiac consultation requested for this very pleasant 50 year old woman who is a [employer] employee, referred for assessment of chest discomfort.  [name] has enjoyed generally good health, but her medical issues include multiple cardiovascular risk factors that have been well- controlled medically including hypertension, hyperlipidemia and diabetes.  She now presents with a 72-hour history of chest discomfort, described as mild to moderate mid chest pressure radiating to the left arm.  Episodes occur intermittently and unpredictably, occurring both at rest and with activity.  There was some associated dyspnea with diaphoresis.  She has experienced no nausea.  She has associated pain in the left scapular region.  There is no clear relationship to movement of the left arm or trunk.  PAST MEDICAL HISTORY:  Notable for a questionable history of TIA in addition to the cardiovascular risk factors noted above.  PRIOR SURGERIES:  Have included total abdominal hysterectomy secondary to benign disease in 2003.  CURRENT MEDICATIONS:  Are as listed in the medical reconciliation form.  ALLERGIES:  She has no known drug allergies.  SOCIAL HISTORY:  Married and lives locally.  No use of tobacco products nor alcohol.  FAMILY HISTORY:  Notable for hypertension in both her father and mother. A brother is deceased as the result of leukemia.  There was no prominent occurrence of coronary or vascular disease.  REVIEW OF SYSTEMS:  No recent weight loss; stable  appetite without dietary restrictions; all other systems reviewed and are negative.  PHYSICAL EXAMINATION:  GENERAL:  Pleasant woman, in no acute distress. VITAL SIGNS:  Temperature is 98.7, heart rate 65 and regular, respirations 18, blood pressure 150/95, O2 saturation 100% on room air. HEENT:  Anicteric sclerae; normal lids and conjunctivae; normal oral mucosa. NECK:  No jugular venous distention; normal carotid upstrokes without bruits. ENDOCRINE:  Question mild thyroid enlargement. HEMATOPOIETIC:  No adenopathy. BODY HABITUS:  Obese. CARDIOVASCULAR:  Normal first and second heart sounds; no murmur nor gallop appreciated. LUNGS:  Clear. ABDOMEN:  Soft and nontender; normal bowel sounds; no masses; no organomegaly. NEUROLOGIC:  Symmetric strength and tone; normal cranial nerves. EXTREMITIES:  Normal distal pulses; no edema. MUSCULOSKELETAL:  No joint deformities.  LABORATORY: 1. Notable for minimal normocytic anemia with a hemoglobin of 11.5,     normal D-dimer, glucose values ranging between 160 and 265 and a     normal metabolic profile except for elevated glucose.  Cardiac     markers are normal.  Lipid profile was suboptimal with total     cholesterol of 236, triglycerides of 124, HDL of 51 and LDL of 160.     TSH is normal. 2. Chest x-ray:  Within normal limits. 3. EKG:  Normal sinus rhythm; ST-T  wave abnormalities consistent with     inferior ischemia or LVH; delayed R-wave progression; no change     compared with previous tracing of August 04, 2006.  IMPRESSION:  [name] presents with multiple cardiovascular risk factors and chest discomfort that is somewhat worrisome for myocardial ischemia. Negative cardiac markers are somewhat reassuring, but EKG is abnormal at baseline although unchanged with symptoms and unchanged from a previous tracing of 4 years ago.  The possibility that her symptoms are attributable to coronary disease is less than more likely, but still  has an appreciable probability.  Accordingly, we will proceed with a stress nuclear study.  Hyperlipidemia is moderate, and in the setting of diabetes, should be treated pharmacologically.  Better control of blood pressure would be desirable.  Medical therapy will be adjusted.  If her stress test is negative, she can be discharged with followup by her primary care physician.  Chest discomfort sounds most likely to be of musculoskeletal etiology, and a nonsteroidal medication will be added to her current therapy.     Gerrit Friends. Dietrich Pates, MD, [facility]     RMR/MEDQ  D:  01/01/2011  T:  01/01/2011  Job:  [id number]  Electronically Signed by Chesilhurst Bing MD [facility] on 01/15/2011 10:31:55 AM

## 2014-01-10 ENCOUNTER — Other Ambulatory Visit: Payer: Self-pay | Admitting: Family Medicine

## 2014-01-10 DIAGNOSIS — R1031 Right lower quadrant pain: Secondary | ICD-10-CM

## 2014-01-13 ENCOUNTER — Ambulatory Visit
Admission: RE | Admit: 2014-01-13 | Discharge: 2014-01-13 | Disposition: A | Discharge status: Home / Self Care | Payer: BC Managed Care – PPO | Source: Ambulatory Visit | Attending: Family Medicine | Admitting: Family Medicine

## 2014-01-13 DIAGNOSIS — R1031 Right lower quadrant pain: Secondary | ICD-10-CM

## 2014-01-13 MED ORDER — IOHEXOL 300 MG/ML  SOLN
125.0000 mL | Freq: Once | INTRAMUSCULAR | Status: AC | PRN
  Administered 2014-01-13: 125 mL via INTRAVENOUS

## 2014-10-02 ENCOUNTER — Other Ambulatory Visit: Payer: Self-pay | Admitting: Family Medicine

## 2014-10-02 ENCOUNTER — Ambulatory Visit
Admission: RE | Admit: 2014-10-02 | Discharge: 2014-10-02 | Disposition: A | Discharge status: Home / Self Care | Payer: BC Managed Care – PPO | Source: Ambulatory Visit | Attending: Family Medicine | Admitting: Family Medicine

## 2014-10-02 DIAGNOSIS — M130 Polyarthritis, unspecified: Secondary | ICD-10-CM

## 2015-01-02 ENCOUNTER — Other Ambulatory Visit (HOSPITAL_COMMUNITY): Payer: Self-pay | Admitting: Family Medicine

## 2015-01-02 DIAGNOSIS — Z1231 Encounter for screening mammogram for malignant neoplasm of breast: Secondary | ICD-10-CM

## 2015-01-05 ENCOUNTER — Ambulatory Visit (HOSPITAL_COMMUNITY)
Admission: RE | Admit: 2015-01-05 | Discharge: 2015-01-05 | Disposition: A | Discharge status: Home / Self Care | Payer: BC Managed Care – PPO | Source: Ambulatory Visit | Attending: Family Medicine | Admitting: Family Medicine

## 2015-01-05 DIAGNOSIS — Z1231 Encounter for screening mammogram for malignant neoplasm of breast: Secondary | ICD-10-CM | POA: Diagnosis present

## 2015-01-09 ENCOUNTER — Other Ambulatory Visit: Payer: Self-pay | Admitting: Family Medicine

## 2015-01-09 DIAGNOSIS — R928 Other abnormal and inconclusive findings on diagnostic imaging of breast: Secondary | ICD-10-CM

## 2015-01-31 ENCOUNTER — Ambulatory Visit
Admission: RE | Admit: 2015-01-31 | Discharge: 2015-01-31 | Disposition: A | Discharge status: Home / Self Care | Payer: BC Managed Care – PPO | Source: Ambulatory Visit | Attending: Family Medicine | Admitting: Family Medicine

## 2015-01-31 DIAGNOSIS — R928 Other abnormal and inconclusive findings on diagnostic imaging of breast: Secondary | ICD-10-CM

## 2015-07-26 ENCOUNTER — Other Ambulatory Visit: Payer: Self-pay | Admitting: Family Medicine

## 2015-07-26 DIAGNOSIS — R921 Mammographic calcification found on diagnostic imaging of breast: Secondary | ICD-10-CM

## 2015-08-01 ENCOUNTER — Ambulatory Visit
Admission: RE | Admit: 2015-08-01 | Discharge: 2015-08-01 | Disposition: A | Discharge status: Home / Self Care | Payer: BC Managed Care – PPO | Source: Ambulatory Visit | Attending: Family Medicine | Admitting: Family Medicine

## 2015-08-01 DIAGNOSIS — R921 Mammographic calcification found on diagnostic imaging of breast: Secondary | ICD-10-CM | POA: Diagnosis not present

## 2015-08-23 DIAGNOSIS — E1149 Type 2 diabetes mellitus with other diabetic neurological complication: Secondary | ICD-10-CM | POA: Diagnosis not present

## 2015-08-23 DIAGNOSIS — E6609 Other obesity due to excess calories: Secondary | ICD-10-CM | POA: Diagnosis not present

## 2015-08-23 DIAGNOSIS — L209 Atopic dermatitis, unspecified: Secondary | ICD-10-CM | POA: Diagnosis not present

## 2015-08-23 DIAGNOSIS — M130 Polyarthritis, unspecified: Secondary | ICD-10-CM | POA: Diagnosis not present

## 2015-08-23 DIAGNOSIS — I10 Essential (primary) hypertension: Secondary | ICD-10-CM | POA: Diagnosis not present

## 2015-08-23 DIAGNOSIS — E782 Mixed hyperlipidemia: Secondary | ICD-10-CM | POA: Diagnosis not present

## 2015-12-13 ENCOUNTER — Encounter (HOSPITAL_COMMUNITY): Payer: Self-pay | Admitting: *Deleted

## 2015-12-13 ENCOUNTER — Emergency Department (HOSPITAL_COMMUNITY)
Admission: EM | Admit: 2015-12-13 | Discharge: 2015-12-13 | Disposition: A | Discharge status: Home / Self Care | Payer: BC Managed Care – PPO | Attending: Emergency Medicine | Admitting: Emergency Medicine

## 2015-12-13 DIAGNOSIS — I10 Essential (primary) hypertension: Secondary | ICD-10-CM | POA: Diagnosis not present

## 2015-12-13 DIAGNOSIS — E119 Type 2 diabetes mellitus without complications: Secondary | ICD-10-CM | POA: Insufficient documentation

## 2015-12-13 DIAGNOSIS — M25512 Pain in left shoulder: Secondary | ICD-10-CM | POA: Insufficient documentation

## 2015-12-13 DIAGNOSIS — M79602 Pain in left arm: Secondary | ICD-10-CM | POA: Insufficient documentation

## 2015-12-13 HISTORY — DX: Hyperlipidemia, unspecified: E78.5

## 2015-12-13 HISTORY — DX: Type 2 diabetes mellitus without complications: E11.9

## 2015-12-13 HISTORY — DX: Essential (primary) hypertension: I10

## 2015-12-13 LAB — CBG MONITORING, ED: Glucose-Capillary: 245 mg/dL — ABNORMAL HIGH (ref 65–99)

## 2015-12-13 MED ORDER — NAPROXEN 500 MG PO TABS: 500.0000 mg | ORAL_TABLET | Freq: Two times a day (BID) | ORAL | 0 refills | Status: DC

## 2015-12-13 MED ORDER — CYCLOBENZAPRINE HCL 10 MG PO TABS: 10.0000 mg | ORAL_TABLET | Freq: Two times a day (BID) | ORAL | 0 refills | Status: DC | PRN

## 2015-12-13 NOTE — Discharge Instructions (Signed)
Please take Naproxen with food and Flexeril to help with your left arm discomfort.  Follow up with your primary care provider for further care.

## 2015-12-13 NOTE — ED Triage Notes (Signed)
Pt states that she has had left arm / shoulder pain x 1 week; pt states that it hurts to raise left arm above the shoulder; pt states that she began to have tingling while at work tonight; pt states that is has gotten progressively worse throughout the night; pt denies injury to left arm

## 2015-12-13 NOTE — ED Provider Notes (Signed)
[facility]-EMERGENCY DEPT Provider Note   CSN: [id number] Arrival date & time: 12/13/15  0630     History   Chief Complaint Chief Complaint  Patient presents with  . Arm Pain    HPI [name] is a 55 y.o. female.  HPI   55 year old female with history of diabetes, hypertension, hyperlipidemia presenting with complaints of left arm discomfort. Patient report for the past week she has had intermittent left arm discomfort. Symptoms started in her left shoulder and radiates towards her arm. She reports increasing pain with shoulder movement especially if she raises her arm above her shoulder. Last night she also report intermittent tingling sensation to the ulnar aspects of her hand which concerns her. Her pain is mild to moderate in intensity. She denies any associated fever, neck pain, chest pain, shortness of breath, lightheadedness, dizziness, diaphoresis, focal weakness, or rash. She is right arm dominant. She denies any recent strenuous activities or heavy lifting. She tries to avoid sleeping on that side. She denies any specific treatment tried. She does not have any history of PE or DVT and no significant risk factors.  Past Medical History:  Diagnosis Date  . Diabetes mellitus without complication (HCC)   . Hyperlipidemia   . Hypertension     There are no active problems to display for this patient.   Past Surgical History:  Procedure Laterality Date  . ABDOMINAL HYSTERECTOMY      OB History    No data available       Home Medications    Prior to Admission medications   Not on File    Family History No family history on file.  Social History Social History  Substance Use Topics  . Smoking status: Never Smoker  . Smokeless tobacco: Never Used  . Alcohol use No     Allergies   Review of patient's allergies indicates no known allergies.   Review of Systems Review of Systems  All other systems reviewed and are negative.    Physical Exam Updated  Vital Signs BP 144/85   Pulse 71   Temp 97.8 F (36.6 C) (Oral)   Resp 16   Ht 5\' 3"  (1.6 m)   Wt 108.9 kg   SpO2 96%   BMI 42.51 kg/m   Physical Exam  Constitutional: She appears well-developed and well-nourished. No distress.  Obese female laying in bed in no acute discomfort.  HENT:  Head: Atraumatic.  Eyes: Conjunctivae are normal.  Neck: Normal range of motion. Neck supple.  No nuchal rigidity, no significant midline cervical spine tenderness crepitus or step-off. Neck with full range of motion.  Cardiovascular: Intact distal pulses.   Musculoskeletal: She exhibits tenderness (Tenderness to left shoulder at the lateral deltoid and at [facility] joint on palpation with increased pain from arm raise but FROM intact.  L Arm compartment is soft, radial pulse 2+ and normal grip strength).  Neurological: She is alert.  Skin: No rash noted.  Psychiatric: She has a normal mood and affect.  Nursing note and vitals reviewed.    ED Treatments / Results  Labs (all labs ordered are listed, but only abnormal results are displayed) Labs Reviewed  CBG MONITORING, ED - Abnormal; Notable for the following:       Result Value   Glucose-Capillary 245 (*)    All other components within normal limits    EKG  EKG Interpretation  Date/Time:  Thursday December 13 2015 06:51:04 EDT Ventricular Rate:  68 PR Interval:  QRS Duration: 90 QT Interval:  406 QTC Calculation: 432 R Axis:   -18 Text Interpretation:  Sinus rhythm Left ventricular hypertrophy Probable anterior infarct, age indeterminate Confirmed by [facility]  MD, APRIL ([id number]) on 12/13/2015 7:09:03 AM       Radiology No results found.  Procedures Procedures (including critical care time)  Medications Ordered in ED Medications - No data to display   Initial Impression / Assessment and Plan / ED Course  I have reviewed the triage vital signs and the nursing notes.  Pertinent labs & imaging results that were available  during my care of the patient were reviewed by me and considered in my medical decision making (see chart for details).  Clinical Course    BP 121/77   Pulse 65   Temp 97.8 F (36.6 C) (Oral)   Resp 20   Ht 5\' 3"  (1.6 m)   Wt 108.9 kg   SpO2 97%   BMI 42.51 kg/m    Final Clinical Impressions(s) / ED Diagnoses   Final diagnoses:  None    New Prescriptions New Prescriptions   No medications on file   7:52 AM Patient presents with left shoulder and left arm pain worsened with movement. Pain is reproducible on exam. It is likely musculoskeletal in origin. Suspect adhesive capsulitis. She has intact sensation throughout her left arm and intact radial pulse and normal grip strength. Her forearm and upper arm compartment is soft. No signs of infection. No obvious injury. No significant neck pain to suggest cervical radiculopathy. I have low suspicion for ACS causing her left arm pain. I have low suspicion for DVT as well. At this time I will provide symptomatic treatment with muscle relaxant and NSAIDs. Recommend follow-up with PCP for further care, return precaution discussed. Patient otherwise afebrile stable normal vital sign.   Fayrene HelperBowie Zephaniah Lubrano, PA-C 12/13/15 [id number]    Mancel BaleElliott Wentz, MD 12/13/15 1452

## 2015-12-24 DIAGNOSIS — E6609 Other obesity due to excess calories: Secondary | ICD-10-CM | POA: Diagnosis not present

## 2015-12-24 DIAGNOSIS — M130 Polyarthritis, unspecified: Secondary | ICD-10-CM | POA: Diagnosis not present

## 2015-12-24 DIAGNOSIS — I10 Essential (primary) hypertension: Secondary | ICD-10-CM | POA: Diagnosis not present

## 2015-12-24 DIAGNOSIS — E1149 Type 2 diabetes mellitus with other diabetic neurological complication: Secondary | ICD-10-CM | POA: Diagnosis not present

## 2016-01-24 DIAGNOSIS — M25512 Pain in left shoulder: Secondary | ICD-10-CM | POA: Diagnosis not present

## 2016-01-24 DIAGNOSIS — E782 Mixed hyperlipidemia: Secondary | ICD-10-CM | POA: Diagnosis not present

## 2016-01-24 DIAGNOSIS — Z6841 Body Mass Index (BMI) 40.0 and over, adult: Secondary | ICD-10-CM | POA: Diagnosis not present

## 2016-01-24 DIAGNOSIS — I10 Essential (primary) hypertension: Secondary | ICD-10-CM | POA: Diagnosis not present

## 2016-01-30 DIAGNOSIS — M25512 Pain in left shoulder: Secondary | ICD-10-CM | POA: Diagnosis not present

## 2016-02-08 DIAGNOSIS — M25512 Pain in left shoulder: Secondary | ICD-10-CM | POA: Diagnosis not present

## 2016-02-13 DIAGNOSIS — M25512 Pain in left shoulder: Secondary | ICD-10-CM | POA: Diagnosis not present

## 2016-02-25 DIAGNOSIS — R1030 Lower abdominal pain, unspecified: Secondary | ICD-10-CM | POA: Diagnosis not present

## 2016-02-25 DIAGNOSIS — E1149 Type 2 diabetes mellitus with other diabetic neurological complication: Secondary | ICD-10-CM | POA: Diagnosis not present

## 2016-02-25 DIAGNOSIS — I10 Essential (primary) hypertension: Secondary | ICD-10-CM | POA: Diagnosis not present

## 2016-04-03 ENCOUNTER — Other Ambulatory Visit: Payer: Self-pay | Admitting: Orthopedic Surgery

## 2016-04-03 DIAGNOSIS — M25512 Pain in left shoulder: Secondary | ICD-10-CM | POA: Diagnosis not present

## 2016-04-04 DIAGNOSIS — E1149 Type 2 diabetes mellitus with other diabetic neurological complication: Secondary | ICD-10-CM | POA: Diagnosis not present

## 2016-04-04 DIAGNOSIS — E782 Mixed hyperlipidemia: Secondary | ICD-10-CM | POA: Diagnosis not present

## 2016-04-04 DIAGNOSIS — M940 Chondrocostal junction syndrome [Tietze]: Secondary | ICD-10-CM | POA: Diagnosis not present

## 2016-04-04 DIAGNOSIS — I10 Essential (primary) hypertension: Secondary | ICD-10-CM | POA: Diagnosis not present

## 2016-04-05 DIAGNOSIS — E6609 Other obesity due to excess calories: Secondary | ICD-10-CM | POA: Diagnosis not present

## 2016-04-05 DIAGNOSIS — E782 Mixed hyperlipidemia: Secondary | ICD-10-CM | POA: Diagnosis not present

## 2016-04-05 DIAGNOSIS — I10 Essential (primary) hypertension: Secondary | ICD-10-CM | POA: Diagnosis not present

## 2016-04-05 DIAGNOSIS — L303 Infective dermatitis: Secondary | ICD-10-CM | POA: Diagnosis not present

## 2016-04-13 ENCOUNTER — Inpatient Hospital Stay: Admission: RE | Admit: 2016-04-13 | Payer: BC Managed Care – PPO | Source: Ambulatory Visit

## 2016-04-17 ENCOUNTER — Other Ambulatory Visit: Payer: Self-pay | Admitting: Family Medicine

## 2016-04-17 DIAGNOSIS — R921 Mammographic calcification found on diagnostic imaging of breast: Secondary | ICD-10-CM

## 2016-04-28 ENCOUNTER — Ambulatory Visit
Admission: RE | Admit: 2016-04-28 | Discharge: 2016-04-28 | Disposition: A | Discharge status: Home / Self Care | Payer: BLUE CROSS/BLUE SHIELD | Source: Ambulatory Visit | Attending: Orthopedic Surgery | Admitting: Orthopedic Surgery

## 2016-04-28 ENCOUNTER — Ambulatory Visit
Admission: RE | Admit: 2016-04-28 | Discharge: 2016-04-28 | Disposition: A | Discharge status: Home / Self Care | Payer: BLUE CROSS/BLUE SHIELD | Source: Ambulatory Visit | Attending: Family Medicine | Admitting: Family Medicine

## 2016-04-28 DIAGNOSIS — M25512 Pain in left shoulder: Secondary | ICD-10-CM | POA: Diagnosis not present

## 2016-04-28 DIAGNOSIS — R921 Mammographic calcification found on diagnostic imaging of breast: Secondary | ICD-10-CM | POA: Diagnosis not present

## 2016-07-07 DIAGNOSIS — E782 Mixed hyperlipidemia: Secondary | ICD-10-CM | POA: Diagnosis not present

## 2016-07-07 DIAGNOSIS — E1149 Type 2 diabetes mellitus with other diabetic neurological complication: Secondary | ICD-10-CM | POA: Diagnosis not present

## 2016-07-07 DIAGNOSIS — L209 Atopic dermatitis, unspecified: Secondary | ICD-10-CM | POA: Diagnosis not present

## 2016-07-07 DIAGNOSIS — I10 Essential (primary) hypertension: Secondary | ICD-10-CM | POA: Diagnosis not present

## 2016-07-07 DIAGNOSIS — E6609 Other obesity due to excess calories: Secondary | ICD-10-CM | POA: Diagnosis not present

## 2016-07-14 DIAGNOSIS — M7582 Other shoulder lesions, left shoulder: Secondary | ICD-10-CM | POA: Diagnosis not present

## 2016-07-18 DIAGNOSIS — L821 Other seborrheic keratosis: Secondary | ICD-10-CM | POA: Diagnosis not present

## 2016-07-18 DIAGNOSIS — L810 Postinflammatory hyperpigmentation: Secondary | ICD-10-CM | POA: Diagnosis not present

## 2016-07-28 ENCOUNTER — Emergency Department (HOSPITAL_COMMUNITY)
Admission: EM | Admit: 2016-07-28 | Discharge: 2016-07-28 | Disposition: A | Discharge status: Home / Self Care | Payer: BLUE CROSS/BLUE SHIELD | Attending: Emergency Medicine | Admitting: Emergency Medicine

## 2016-07-28 ENCOUNTER — Encounter (HOSPITAL_COMMUNITY): Payer: Self-pay | Admitting: Emergency Medicine

## 2016-07-28 ENCOUNTER — Emergency Department (HOSPITAL_COMMUNITY): Payer: BLUE CROSS/BLUE SHIELD

## 2016-07-28 DIAGNOSIS — M7502 Adhesive capsulitis of left shoulder: Secondary | ICD-10-CM | POA: Diagnosis not present

## 2016-07-28 DIAGNOSIS — I10 Essential (primary) hypertension: Secondary | ICD-10-CM | POA: Insufficient documentation

## 2016-07-28 DIAGNOSIS — Z79899 Other long term (current) drug therapy: Secondary | ICD-10-CM | POA: Diagnosis not present

## 2016-07-28 DIAGNOSIS — E119 Type 2 diabetes mellitus without complications: Secondary | ICD-10-CM | POA: Insufficient documentation

## 2016-07-28 DIAGNOSIS — M19012 Primary osteoarthritis, left shoulder: Secondary | ICD-10-CM | POA: Diagnosis not present

## 2016-07-28 DIAGNOSIS — M25512 Pain in left shoulder: Secondary | ICD-10-CM | POA: Diagnosis not present

## 2016-07-28 MED ORDER — KETOROLAC TROMETHAMINE 60 MG/2ML IM SOLN
60.0000 mg | Freq: Once | INTRAMUSCULAR | Status: AC
  Administered 2016-07-28: 60 mg via INTRAMUSCULAR
  Filled 2016-07-28: qty 2

## 2016-07-28 NOTE — ED Triage Notes (Signed)
Pt received cortisone shot in left shoulder 2 weeks ago.  States she is having severe pain today and it has never felt like this before.  Reports having x-rays and MRI's in the past.  Pt tearful. Denies chest pain.

## 2016-07-28 NOTE — ED Provider Notes (Signed)
[facility]-EMERGENCY DEPT Provider Note   CSN: [id number] Arrival date & time: 07/28/16  1950     History   Chief Complaint Chief Complaint  Patient presents with  . Arm Pain    HPI [name] is a 56 y.o. female with PMHx DM, HLD, and HTN with chief complaint constant left shoulder pain which began a few hours PTA after receiving a steroid injection in her shoulder by her orthopedist. Pt state she has had constant left shoulder pain for six months, and was evaluated by an orthopedist who gave her her first steroid injection 2 weeks ago. She states pain improved somewhat after that time but returned and on re-evaluation today at around 12:30pm her orthopedist gave her another shot in her shoulder. She states her pain is typically 7/10 sharp pain worsened with movement and lifting aove her head, and localized to left shoulder radiating down to her deltoid region. At times pain radiates down her arm to her left 4th and 5th digits. Pain began around 17:30 tonight and is similar to her typical pain except more severe. She took one hydrocodone after pain began and states it has been helpful with reduction of pain to 8/10. She denies any falls, trauma, numbness, tingling, or neck pain.   The history is provided by the patient.    Past Medical History:  Diagnosis Date  . Diabetes mellitus without complication (HCC)   . Hyperlipidemia   . Hypertension     There are no active problems to display for this patient.   Past Surgical History:  Procedure Laterality Date  . ABDOMINAL HYSTERECTOMY      OB History    No data available       Home Medications    Prior to Admission medications   Medication Sig Start Date End Date Taking? Authorizing Provider  cyclobenzaprine (FLEXERIL) 10 MG tablet Take 1 tablet (10 mg total) by mouth 2 (two) times daily as needed for muscle spasms. 12/13/15   Fayrene Helper, PA-C  naproxen (NAPROSYN) 500 MG tablet Take 1 tablet (500 mg total) by mouth 2 (two) times  daily. 12/13/15   Fayrene Helper, PA-C    Family History No family history on file.  Social History Social History  Substance Use Topics  . Smoking status: Never Smoker  . Smokeless tobacco: Never Used  . Alcohol use No     Allergies   Patient has no known allergies.   Review of Systems Review of Systems  Constitutional: Negative for diaphoresis and fever.  Respiratory: Negative for shortness of breath.   Cardiovascular: Negative for chest pain.  Gastrointestinal: Negative for abdominal pain, blood in stool, diarrhea, nausea and vomiting.  Genitourinary: Negative for dysuria and hematuria.  Musculoskeletal: Positive for arthralgias. Negative for back pain, joint swelling and neck pain.  Neurological: Negative for weakness, light-headedness, numbness and headaches.     Physical Exam Updated Vital Signs BP 125/87 (BP Location: Right Arm)   Pulse 79   Temp 98.2 F (36.8 C) (Oral)   Resp 18   Ht  (1.6 m)   Wt 103.4 kg   SpO2 94%   BMI 40.39 kg/m   Physical Exam  Constitutional: She is oriented to person, place, and time. She appears well-developed and well-nourished. No distress.  HENT:  Head: Normocephalic and atraumatic.  Eyes: Conjunctivae and EOM are normal. Right eye exhibits no discharge. Left eye exhibits no discharge. No scleral icterus.  Neck: Normal range of motion. JVD present. Tracheal deviation present.  Cardiovascular: Normal rate and intact distal pulses.   2+ radial pulses b/l  Pulmonary/Chest: Effort normal.  Abdominal: She exhibits no distension.  Musculoskeletal: She exhibits tenderness. She exhibits no edema or deformity.  Limited ROM of left shoulder due to pain. Pain with both passive and active ROM. Positive empty can test. Pt unable to crossover due to pain. 5/5 strength of biceps and wrists of BUE. Good grip strength. Tenderness to palpation along left AC joint and deltoid. Left arm compartment soft  Neurological: She is alert and oriented  to person, place, and time. No sensory deficit.  Fluent speech, no facial droop, gross sensation intact  Skin: Skin is warm and dry. Capillary refill takes less than 2 seconds. She is not diaphoretic.  No erythema or ecchymosis overlying injection site.   Psychiatric: She has a normal mood and affect. Her behavior is normal.     ED Treatments / Results  Labs (all labs ordered are listed, but only abnormal results are displayed) Labs Reviewed - No data to display  EKG  EKG Interpretation None       Radiology Dg Shoulder Left  Result Date: 07/28/2016 CLINICAL DATA:  Severe anterior left shoulder pain, worse with movement. No injury. Cortisone shot 2 weeks ago and another today. Patient is chronic shoulder pain but worse today. EXAM: LEFT SHOULDER - 2+ VIEW COMPARISON:  MRI left shoulder 04/28/2016 FINDINGS: Mild degenerative changes in the acromioclavicular and glenohumeral joints. Small subcortical cystic changes in the humeral head consistent with degenerative cysts. No evidence of acute fracture or dislocation. No expansile or destructive bone lesions. Soft tissues are unremarkable. IMPRESSION: Mild degenerative changes.  No acute bony abnormalities. Electronically Signed   By: Burman Nieves M.D.   On: 07/28/2016 21:14   Dg Humerus Left  Result Date: 07/28/2016 CLINICAL DATA:  56 y/o  F; severe anterior shoulder pain. EXAM: LEFT HUMERUS - 2+ VIEW COMPARISON:  None. FINDINGS: No acute fracture or dislocation is identified. Soft tissues are unremarkable. IMPRESSION: No acute fracture or dislocation identified. Electronically Signed   By: Mitzi Hansen M.D.   On: 07/28/2016 21:12    Procedures Procedures (including critical care time)  Medications Ordered in ED Medications  ketorolac (TORADOL) injection 60 mg (60 mg Intramuscular Given 07/28/16 2318)     Initial Impression / Assessment and Plan / ED Course  I have reviewed the triage vital signs and the nursing  notes.  Pertinent labs & imaging results that were available during my care of the patient were reviewed by me and considered in my medical decision making (see chart for details).     55yof with 6 month history of left shoulder pain with acute exacerbation of symptoms today after receiving steroid injection in her shoulder by her orthopedist. Pt afebrile, VSS. Xray shows no acute fracture or dislocation of the humerus and mild degenerative changes with no acute bony abnormality of the shoulder. Exam limited due to pain but pt able to move arm. Pain reproducible on palpation and movement. Do not suspect compartment syndrome, post-injection infection, or DVT. Norco taken at home and IM toradol given in ED helpful for pain. Discussed that often times pain flares up with steroid injections before it can improve symptoms. No further emergent workup required.  Recommend follow up with orthopedist within the next 5-7 days if symptoms do not improve. Also discussed supportive treatment including RICE, gentle stretching and stregthening exercises, and NSAIDs for pain. Pt has post-injection follow up with orthopedist and is scheduled  to start physical therapy next week which I encouraged her to keep. Discussed strict ED return precautions. Pt verbalized understanding of and agreement with plan and is safe for discharge home.   Final Clinical Impressions(s) / ED Diagnoses   Final diagnoses:  Acute pain of left shoulder    New Prescriptions Discharge Medication List as of 07/28/2016 11:36 PM       Jeanie Sewer, PA-C 07/29/16 0132    Linwood Dibbles, MD 07/29/16 2135

## 2016-07-28 NOTE — ED Notes (Signed)
Pt ambulatory and independent at discharge.  Verbalized understanding of discharge instructions 

## 2016-08-01 DIAGNOSIS — I10 Essential (primary) hypertension: Secondary | ICD-10-CM | POA: Diagnosis not present

## 2016-08-01 DIAGNOSIS — M1310 Monoarthritis, not elsewhere classified, unspecified site: Secondary | ICD-10-CM | POA: Diagnosis not present

## 2016-08-01 DIAGNOSIS — E1149 Type 2 diabetes mellitus with other diabetic neurological complication: Secondary | ICD-10-CM | POA: Diagnosis not present

## 2016-08-01 DIAGNOSIS — L209 Atopic dermatitis, unspecified: Secondary | ICD-10-CM | POA: Diagnosis not present

## 2016-08-05 DIAGNOSIS — M25512 Pain in left shoulder: Secondary | ICD-10-CM | POA: Diagnosis not present

## 2016-08-05 DIAGNOSIS — M7502 Adhesive capsulitis of left shoulder: Secondary | ICD-10-CM | POA: Diagnosis not present

## 2016-08-05 DIAGNOSIS — M25612 Stiffness of left shoulder, not elsewhere classified: Secondary | ICD-10-CM | POA: Diagnosis not present

## 2016-08-11 DIAGNOSIS — M25512 Pain in left shoulder: Secondary | ICD-10-CM | POA: Diagnosis not present

## 2016-08-11 DIAGNOSIS — M7502 Adhesive capsulitis of left shoulder: Secondary | ICD-10-CM | POA: Diagnosis not present

## 2016-08-11 DIAGNOSIS — M25612 Stiffness of left shoulder, not elsewhere classified: Secondary | ICD-10-CM | POA: Diagnosis not present

## 2016-08-13 DIAGNOSIS — M7502 Adhesive capsulitis of left shoulder: Secondary | ICD-10-CM | POA: Diagnosis not present

## 2016-08-13 DIAGNOSIS — M25512 Pain in left shoulder: Secondary | ICD-10-CM | POA: Diagnosis not present

## 2016-08-13 DIAGNOSIS — M25612 Stiffness of left shoulder, not elsewhere classified: Secondary | ICD-10-CM | POA: Diagnosis not present

## 2016-08-18 DIAGNOSIS — M25612 Stiffness of left shoulder, not elsewhere classified: Secondary | ICD-10-CM | POA: Diagnosis not present

## 2016-08-18 DIAGNOSIS — M25512 Pain in left shoulder: Secondary | ICD-10-CM | POA: Diagnosis not present

## 2016-08-18 DIAGNOSIS — M7502 Adhesive capsulitis of left shoulder: Secondary | ICD-10-CM | POA: Diagnosis not present

## 2016-08-20 DIAGNOSIS — M7502 Adhesive capsulitis of left shoulder: Secondary | ICD-10-CM | POA: Diagnosis not present

## 2016-08-20 DIAGNOSIS — M25612 Stiffness of left shoulder, not elsewhere classified: Secondary | ICD-10-CM | POA: Diagnosis not present

## 2016-08-20 DIAGNOSIS — M25512 Pain in left shoulder: Secondary | ICD-10-CM | POA: Diagnosis not present

## 2016-08-25 DIAGNOSIS — M25512 Pain in left shoulder: Secondary | ICD-10-CM | POA: Diagnosis not present

## 2016-08-27 DIAGNOSIS — M7502 Adhesive capsulitis of left shoulder: Secondary | ICD-10-CM | POA: Diagnosis not present

## 2016-08-27 DIAGNOSIS — M25512 Pain in left shoulder: Secondary | ICD-10-CM | POA: Diagnosis not present

## 2016-08-27 DIAGNOSIS — M25612 Stiffness of left shoulder, not elsewhere classified: Secondary | ICD-10-CM | POA: Diagnosis not present

## 2016-09-01 DIAGNOSIS — M25612 Stiffness of left shoulder, not elsewhere classified: Secondary | ICD-10-CM | POA: Diagnosis not present

## 2016-09-01 DIAGNOSIS — M25512 Pain in left shoulder: Secondary | ICD-10-CM | POA: Diagnosis not present

## 2016-09-01 DIAGNOSIS — M7502 Adhesive capsulitis of left shoulder: Secondary | ICD-10-CM | POA: Diagnosis not present

## 2016-09-03 DIAGNOSIS — M25512 Pain in left shoulder: Secondary | ICD-10-CM | POA: Diagnosis not present

## 2016-09-03 DIAGNOSIS — M25612 Stiffness of left shoulder, not elsewhere classified: Secondary | ICD-10-CM | POA: Diagnosis not present

## 2016-09-03 DIAGNOSIS — M7502 Adhesive capsulitis of left shoulder: Secondary | ICD-10-CM | POA: Diagnosis not present

## 2016-09-10 DIAGNOSIS — M7502 Adhesive capsulitis of left shoulder: Secondary | ICD-10-CM | POA: Diagnosis not present

## 2016-09-10 DIAGNOSIS — M25612 Stiffness of left shoulder, not elsewhere classified: Secondary | ICD-10-CM | POA: Diagnosis not present

## 2016-09-10 DIAGNOSIS — M25512 Pain in left shoulder: Secondary | ICD-10-CM | POA: Diagnosis not present

## 2016-09-12 DIAGNOSIS — M25512 Pain in left shoulder: Secondary | ICD-10-CM | POA: Diagnosis not present

## 2016-09-12 DIAGNOSIS — M25612 Stiffness of left shoulder, not elsewhere classified: Secondary | ICD-10-CM | POA: Diagnosis not present

## 2016-09-12 DIAGNOSIS — M7502 Adhesive capsulitis of left shoulder: Secondary | ICD-10-CM | POA: Diagnosis not present

## 2016-09-15 DIAGNOSIS — M7502 Adhesive capsulitis of left shoulder: Secondary | ICD-10-CM | POA: Diagnosis not present

## 2016-09-15 DIAGNOSIS — M25612 Stiffness of left shoulder, not elsewhere classified: Secondary | ICD-10-CM | POA: Diagnosis not present

## 2016-09-15 DIAGNOSIS — M25512 Pain in left shoulder: Secondary | ICD-10-CM | POA: Diagnosis not present

## 2016-09-18 DIAGNOSIS — M25612 Stiffness of left shoulder, not elsewhere classified: Secondary | ICD-10-CM | POA: Diagnosis not present

## 2016-09-18 DIAGNOSIS — M7502 Adhesive capsulitis of left shoulder: Secondary | ICD-10-CM | POA: Diagnosis not present

## 2016-09-18 DIAGNOSIS — M25512 Pain in left shoulder: Secondary | ICD-10-CM | POA: Diagnosis not present

## 2016-09-22 DIAGNOSIS — M7502 Adhesive capsulitis of left shoulder: Secondary | ICD-10-CM | POA: Diagnosis not present

## 2016-09-30 DIAGNOSIS — M25612 Stiffness of left shoulder, not elsewhere classified: Secondary | ICD-10-CM | POA: Diagnosis not present

## 2016-09-30 DIAGNOSIS — M25512 Pain in left shoulder: Secondary | ICD-10-CM | POA: Diagnosis not present

## 2016-09-30 DIAGNOSIS — M7502 Adhesive capsulitis of left shoulder: Secondary | ICD-10-CM | POA: Diagnosis not present

## 2016-10-02 DIAGNOSIS — M25512 Pain in left shoulder: Secondary | ICD-10-CM | POA: Diagnosis not present

## 2016-10-02 DIAGNOSIS — M25612 Stiffness of left shoulder, not elsewhere classified: Secondary | ICD-10-CM | POA: Diagnosis not present

## 2016-10-02 DIAGNOSIS — M7502 Adhesive capsulitis of left shoulder: Secondary | ICD-10-CM | POA: Diagnosis not present

## 2016-10-07 DIAGNOSIS — M7502 Adhesive capsulitis of left shoulder: Secondary | ICD-10-CM | POA: Diagnosis not present

## 2016-10-07 DIAGNOSIS — M25612 Stiffness of left shoulder, not elsewhere classified: Secondary | ICD-10-CM | POA: Diagnosis not present

## 2016-10-07 DIAGNOSIS — M25512 Pain in left shoulder: Secondary | ICD-10-CM | POA: Diagnosis not present

## 2016-10-09 DIAGNOSIS — M7502 Adhesive capsulitis of left shoulder: Secondary | ICD-10-CM | POA: Diagnosis not present

## 2016-10-09 DIAGNOSIS — M25512 Pain in left shoulder: Secondary | ICD-10-CM | POA: Diagnosis not present

## 2016-10-09 DIAGNOSIS — M25612 Stiffness of left shoulder, not elsewhere classified: Secondary | ICD-10-CM | POA: Diagnosis not present

## 2016-10-20 DIAGNOSIS — M25512 Pain in left shoulder: Secondary | ICD-10-CM | POA: Diagnosis not present

## 2016-10-23 DIAGNOSIS — M7502 Adhesive capsulitis of left shoulder: Secondary | ICD-10-CM | POA: Diagnosis not present

## 2016-10-23 DIAGNOSIS — M25512 Pain in left shoulder: Secondary | ICD-10-CM | POA: Diagnosis not present

## 2016-10-23 DIAGNOSIS — M25612 Stiffness of left shoulder, not elsewhere classified: Secondary | ICD-10-CM | POA: Diagnosis not present

## 2016-11-19 DIAGNOSIS — I10 Essential (primary) hypertension: Secondary | ICD-10-CM | POA: Diagnosis not present

## 2016-11-19 DIAGNOSIS — E782 Mixed hyperlipidemia: Secondary | ICD-10-CM | POA: Diagnosis not present

## 2016-11-19 DIAGNOSIS — E1149 Type 2 diabetes mellitus with other diabetic neurological complication: Secondary | ICD-10-CM | POA: Diagnosis not present

## 2016-11-19 DIAGNOSIS — E6609 Other obesity due to excess calories: Secondary | ICD-10-CM | POA: Diagnosis not present

## 2016-11-20 DIAGNOSIS — E1149 Type 2 diabetes mellitus with other diabetic neurological complication: Secondary | ICD-10-CM | POA: Diagnosis not present

## 2016-11-20 DIAGNOSIS — E782 Mixed hyperlipidemia: Secondary | ICD-10-CM | POA: Diagnosis not present

## 2016-11-20 DIAGNOSIS — Z6841 Body Mass Index (BMI) 40.0 and over, adult: Secondary | ICD-10-CM | POA: Diagnosis not present

## 2016-12-11 DIAGNOSIS — M130 Polyarthritis, unspecified: Secondary | ICD-10-CM | POA: Diagnosis not present

## 2016-12-11 DIAGNOSIS — I10 Essential (primary) hypertension: Secondary | ICD-10-CM | POA: Diagnosis not present

## 2016-12-11 DIAGNOSIS — E1149 Type 2 diabetes mellitus with other diabetic neurological complication: Secondary | ICD-10-CM | POA: Diagnosis not present

## 2017-03-03 DIAGNOSIS — E1149 Type 2 diabetes mellitus with other diabetic neurological complication: Secondary | ICD-10-CM | POA: Diagnosis not present

## 2017-03-03 DIAGNOSIS — E782 Mixed hyperlipidemia: Secondary | ICD-10-CM | POA: Diagnosis not present

## 2017-03-03 DIAGNOSIS — M130 Polyarthritis, unspecified: Secondary | ICD-10-CM | POA: Diagnosis not present

## 2017-03-03 DIAGNOSIS — I10 Essential (primary) hypertension: Secondary | ICD-10-CM | POA: Diagnosis not present

## 2017-03-04 DIAGNOSIS — E782 Mixed hyperlipidemia: Secondary | ICD-10-CM | POA: Diagnosis not present

## 2017-03-04 DIAGNOSIS — E6609 Other obesity due to excess calories: Secondary | ICD-10-CM | POA: Diagnosis not present

## 2017-03-04 DIAGNOSIS — I10 Essential (primary) hypertension: Secondary | ICD-10-CM | POA: Diagnosis not present

## 2017-06-03 DIAGNOSIS — J682 Upper respiratory inflammation due to chemicals, gases, fumes and vapors, not elsewhere classified: Secondary | ICD-10-CM | POA: Diagnosis not present

## 2017-06-03 DIAGNOSIS — E1149 Type 2 diabetes mellitus with other diabetic neurological complication: Secondary | ICD-10-CM | POA: Diagnosis not present

## 2017-06-03 DIAGNOSIS — I10 Essential (primary) hypertension: Secondary | ICD-10-CM | POA: Diagnosis not present

## 2017-06-03 DIAGNOSIS — E782 Mixed hyperlipidemia: Secondary | ICD-10-CM | POA: Diagnosis not present

## 2017-06-10 ENCOUNTER — Other Ambulatory Visit: Payer: Self-pay | Admitting: Family Medicine

## 2017-06-10 DIAGNOSIS — R921 Mammographic calcification found on diagnostic imaging of breast: Secondary | ICD-10-CM

## 2017-06-25 ENCOUNTER — Ambulatory Visit
Admission: RE | Admit: 2017-06-25 | Discharge: 2017-06-25 | Disposition: A | Discharge status: Home / Self Care | Payer: BLUE CROSS/BLUE SHIELD | Source: Ambulatory Visit | Attending: Family Medicine | Admitting: Family Medicine

## 2017-06-25 DIAGNOSIS — R921 Mammographic calcification found on diagnostic imaging of breast: Secondary | ICD-10-CM | POA: Diagnosis not present

## 2017-08-31 DIAGNOSIS — I10 Essential (primary) hypertension: Secondary | ICD-10-CM | POA: Diagnosis not present

## 2017-08-31 DIAGNOSIS — E1149 Type 2 diabetes mellitus with other diabetic neurological complication: Secondary | ICD-10-CM | POA: Diagnosis not present

## 2017-08-31 DIAGNOSIS — E6609 Other obesity due to excess calories: Secondary | ICD-10-CM | POA: Diagnosis not present

## 2017-08-31 DIAGNOSIS — E782 Mixed hyperlipidemia: Secondary | ICD-10-CM | POA: Diagnosis not present

## 2017-09-01 DIAGNOSIS — E1149 Type 2 diabetes mellitus with other diabetic neurological complication: Secondary | ICD-10-CM | POA: Diagnosis not present

## 2017-09-01 DIAGNOSIS — E782 Mixed hyperlipidemia: Secondary | ICD-10-CM | POA: Diagnosis not present

## 2017-09-01 DIAGNOSIS — I10 Essential (primary) hypertension: Secondary | ICD-10-CM | POA: Diagnosis not present

## 2017-09-01 DIAGNOSIS — M130 Polyarthritis, unspecified: Secondary | ICD-10-CM | POA: Diagnosis not present

## 2017-12-15 DIAGNOSIS — E1149 Type 2 diabetes mellitus with other diabetic neurological complication: Secondary | ICD-10-CM | POA: Diagnosis not present

## 2017-12-15 DIAGNOSIS — H8309 Labyrinthitis, unspecified ear: Secondary | ICD-10-CM | POA: Diagnosis not present

## 2017-12-15 DIAGNOSIS — Z6839 Body mass index (BMI) 39.0-39.9, adult: Secondary | ICD-10-CM | POA: Diagnosis not present

## 2017-12-15 DIAGNOSIS — I10 Essential (primary) hypertension: Secondary | ICD-10-CM | POA: Diagnosis not present

## 2018-01-06 DIAGNOSIS — R1030 Lower abdominal pain, unspecified: Secondary | ICD-10-CM | POA: Diagnosis not present

## 2018-01-06 DIAGNOSIS — I10 Essential (primary) hypertension: Secondary | ICD-10-CM | POA: Diagnosis not present

## 2018-01-06 DIAGNOSIS — E782 Mixed hyperlipidemia: Secondary | ICD-10-CM | POA: Diagnosis not present

## 2018-01-06 DIAGNOSIS — Z6838 Body mass index (BMI) 38.0-38.9, adult: Secondary | ICD-10-CM | POA: Diagnosis not present

## 2018-03-08 DIAGNOSIS — I10 Essential (primary) hypertension: Secondary | ICD-10-CM | POA: Diagnosis not present

## 2018-03-08 DIAGNOSIS — R1030 Lower abdominal pain, unspecified: Secondary | ICD-10-CM | POA: Diagnosis not present

## 2018-03-08 DIAGNOSIS — E1149 Type 2 diabetes mellitus with other diabetic neurological complication: Secondary | ICD-10-CM | POA: Diagnosis not present

## 2018-03-08 DIAGNOSIS — E782 Mixed hyperlipidemia: Secondary | ICD-10-CM | POA: Diagnosis not present

## 2018-03-09 DIAGNOSIS — Z6839 Body mass index (BMI) 39.0-39.9, adult: Secondary | ICD-10-CM | POA: Diagnosis not present

## 2018-03-09 DIAGNOSIS — I10 Essential (primary) hypertension: Secondary | ICD-10-CM | POA: Diagnosis not present

## 2018-03-09 DIAGNOSIS — E1149 Type 2 diabetes mellitus with other diabetic neurological complication: Secondary | ICD-10-CM | POA: Diagnosis not present

## 2018-05-25 DIAGNOSIS — H8309 Labyrinthitis, unspecified ear: Secondary | ICD-10-CM | POA: Diagnosis not present

## 2018-07-05 DIAGNOSIS — E785 Hyperlipidemia, unspecified: Secondary | ICD-10-CM | POA: Diagnosis not present

## 2018-07-05 DIAGNOSIS — R7309 Other abnormal glucose: Secondary | ICD-10-CM | POA: Diagnosis not present

## 2018-07-05 DIAGNOSIS — I10 Essential (primary) hypertension: Secondary | ICD-10-CM | POA: Diagnosis not present

## 2018-07-05 DIAGNOSIS — E119 Type 2 diabetes mellitus without complications: Secondary | ICD-10-CM | POA: Diagnosis not present

## 2018-07-06 DIAGNOSIS — M940 Chondrocostal junction syndrome [Tietze]: Secondary | ICD-10-CM | POA: Diagnosis not present

## 2018-07-06 DIAGNOSIS — E782 Mixed hyperlipidemia: Secondary | ICD-10-CM | POA: Diagnosis not present

## 2018-07-06 DIAGNOSIS — E1149 Type 2 diabetes mellitus with other diabetic neurological complication: Secondary | ICD-10-CM | POA: Diagnosis not present

## 2018-07-06 DIAGNOSIS — I10 Essential (primary) hypertension: Secondary | ICD-10-CM | POA: Diagnosis not present

## 2018-08-06 DIAGNOSIS — E1149 Type 2 diabetes mellitus with other diabetic neurological complication: Secondary | ICD-10-CM | POA: Diagnosis not present

## 2018-08-06 DIAGNOSIS — I10 Essential (primary) hypertension: Secondary | ICD-10-CM | POA: Diagnosis not present

## 2018-08-06 DIAGNOSIS — E782 Mixed hyperlipidemia: Secondary | ICD-10-CM | POA: Diagnosis not present

## 2018-08-06 DIAGNOSIS — M940 Chondrocostal junction syndrome [Tietze]: Secondary | ICD-10-CM | POA: Diagnosis not present

## 2018-11-12 DIAGNOSIS — M130 Polyarthritis, unspecified: Secondary | ICD-10-CM | POA: Diagnosis not present

## 2018-11-12 DIAGNOSIS — I10 Essential (primary) hypertension: Secondary | ICD-10-CM | POA: Diagnosis not present

## 2018-11-12 DIAGNOSIS — E1169 Type 2 diabetes mellitus with other specified complication: Secondary | ICD-10-CM | POA: Diagnosis not present

## 2018-11-12 DIAGNOSIS — E669 Obesity, unspecified: Secondary | ICD-10-CM | POA: Diagnosis not present

## 2018-12-10 ENCOUNTER — Other Ambulatory Visit: Payer: Self-pay | Admitting: Family Medicine

## 2018-12-10 DIAGNOSIS — Z1231 Encounter for screening mammogram for malignant neoplasm of breast: Secondary | ICD-10-CM

## 2019-01-25 ENCOUNTER — Other Ambulatory Visit: Payer: Self-pay

## 2019-01-25 ENCOUNTER — Ambulatory Visit
Admission: RE | Admit: 2019-01-25 | Discharge: 2019-01-25 | Disposition: A | Discharge status: Home / Self Care | Payer: BC Managed Care – PPO | Source: Ambulatory Visit | Attending: Family Medicine | Admitting: Family Medicine

## 2019-01-25 DIAGNOSIS — Z1231 Encounter for screening mammogram for malignant neoplasm of breast: Secondary | ICD-10-CM | POA: Diagnosis not present

## 2019-03-02 DIAGNOSIS — R7309 Other abnormal glucose: Secondary | ICD-10-CM | POA: Diagnosis not present

## 2019-03-02 DIAGNOSIS — E119 Type 2 diabetes mellitus without complications: Secondary | ICD-10-CM | POA: Diagnosis not present

## 2019-03-02 DIAGNOSIS — E782 Mixed hyperlipidemia: Secondary | ICD-10-CM | POA: Diagnosis not present

## 2019-03-02 DIAGNOSIS — I10 Essential (primary) hypertension: Secondary | ICD-10-CM | POA: Diagnosis not present

## 2019-03-03 DIAGNOSIS — E782 Mixed hyperlipidemia: Secondary | ICD-10-CM | POA: Diagnosis not present

## 2019-03-03 DIAGNOSIS — M130 Polyarthritis, unspecified: Secondary | ICD-10-CM | POA: Diagnosis not present

## 2019-03-03 DIAGNOSIS — I10 Essential (primary) hypertension: Secondary | ICD-10-CM | POA: Diagnosis not present

## 2019-03-03 DIAGNOSIS — E6609 Other obesity due to excess calories: Secondary | ICD-10-CM | POA: Diagnosis not present

## 2019-04-14 DIAGNOSIS — M130 Polyarthritis, unspecified: Secondary | ICD-10-CM | POA: Diagnosis not present

## 2019-04-14 DIAGNOSIS — I10 Essential (primary) hypertension: Secondary | ICD-10-CM | POA: Diagnosis not present

## 2019-04-14 DIAGNOSIS — E1169 Type 2 diabetes mellitus with other specified complication: Secondary | ICD-10-CM | POA: Diagnosis not present

## 2019-06-24 DIAGNOSIS — E785 Hyperlipidemia, unspecified: Secondary | ICD-10-CM | POA: Diagnosis not present

## 2019-06-24 DIAGNOSIS — E1169 Type 2 diabetes mellitus with other specified complication: Secondary | ICD-10-CM | POA: Diagnosis not present

## 2019-06-24 DIAGNOSIS — E7800 Pure hypercholesterolemia, unspecified: Secondary | ICD-10-CM | POA: Diagnosis not present

## 2019-06-24 DIAGNOSIS — I10 Essential (primary) hypertension: Secondary | ICD-10-CM | POA: Diagnosis not present

## 2019-06-24 DIAGNOSIS — M130 Polyarthritis, unspecified: Secondary | ICD-10-CM | POA: Diagnosis not present

## 2019-07-11 ENCOUNTER — Other Ambulatory Visit: Payer: Self-pay | Admitting: Orthopedic Surgery

## 2019-07-11 DIAGNOSIS — M25511 Pain in right shoulder: Secondary | ICD-10-CM

## 2019-07-11 DIAGNOSIS — M542 Cervicalgia: Secondary | ICD-10-CM | POA: Diagnosis not present

## 2019-07-18 DIAGNOSIS — E876 Hypokalemia: Secondary | ICD-10-CM | POA: Diagnosis not present

## 2019-08-03 ENCOUNTER — Other Ambulatory Visit: Payer: Self-pay

## 2019-08-03 ENCOUNTER — Ambulatory Visit
Admission: RE | Admit: 2019-08-03 | Discharge: 2019-08-03 | Disposition: A | Discharge status: Home / Self Care | Payer: BC Managed Care – PPO | Source: Ambulatory Visit | Attending: Orthopedic Surgery | Admitting: Orthopedic Surgery

## 2019-08-03 DIAGNOSIS — M25511 Pain in right shoulder: Secondary | ICD-10-CM | POA: Diagnosis not present

## 2019-08-08 DIAGNOSIS — M25511 Pain in right shoulder: Secondary | ICD-10-CM | POA: Diagnosis not present

## 2019-08-10 ENCOUNTER — Other Ambulatory Visit: Payer: BC Managed Care – PPO

## 2019-08-22 DIAGNOSIS — M6281 Muscle weakness (generalized): Secondary | ICD-10-CM | POA: Diagnosis not present

## 2019-08-22 DIAGNOSIS — M25611 Stiffness of right shoulder, not elsewhere classified: Secondary | ICD-10-CM | POA: Diagnosis not present

## 2019-08-22 DIAGNOSIS — M7501 Adhesive capsulitis of right shoulder: Secondary | ICD-10-CM | POA: Diagnosis not present

## 2019-08-22 DIAGNOSIS — M75111 Incomplete rotator cuff tear or rupture of right shoulder, not specified as traumatic: Secondary | ICD-10-CM | POA: Diagnosis not present

## 2019-08-24 ENCOUNTER — Ambulatory Visit: Payer: BC Managed Care – PPO | Attending: Internal Medicine

## 2019-08-24 DIAGNOSIS — Z20822 Contact with and (suspected) exposure to covid-19: Secondary | ICD-10-CM

## 2019-08-25 DIAGNOSIS — M25611 Stiffness of right shoulder, not elsewhere classified: Secondary | ICD-10-CM | POA: Diagnosis not present

## 2019-08-25 DIAGNOSIS — M25511 Pain in right shoulder: Secondary | ICD-10-CM | POA: Diagnosis not present

## 2019-08-25 DIAGNOSIS — M6281 Muscle weakness (generalized): Secondary | ICD-10-CM | POA: Diagnosis not present

## 2019-08-25 DIAGNOSIS — M75111 Incomplete rotator cuff tear or rupture of right shoulder, not specified as traumatic: Secondary | ICD-10-CM | POA: Diagnosis not present

## 2019-08-25 LAB — NOVEL CORONAVIRUS, NAA: SARS-CoV-2, NAA: NOT DETECTED

## 2019-08-25 LAB — SARS-COV-2, NAA 2 DAY TAT

## 2019-09-01 DIAGNOSIS — M75111 Incomplete rotator cuff tear or rupture of right shoulder, not specified as traumatic: Secondary | ICD-10-CM | POA: Diagnosis not present

## 2019-09-01 DIAGNOSIS — M6281 Muscle weakness (generalized): Secondary | ICD-10-CM | POA: Diagnosis not present

## 2019-09-01 DIAGNOSIS — M25611 Stiffness of right shoulder, not elsewhere classified: Secondary | ICD-10-CM | POA: Diagnosis not present

## 2019-09-01 DIAGNOSIS — M25511 Pain in right shoulder: Secondary | ICD-10-CM | POA: Diagnosis not present

## 2019-09-01 DIAGNOSIS — M7501 Adhesive capsulitis of right shoulder: Secondary | ICD-10-CM | POA: Diagnosis not present

## 2019-09-07 DIAGNOSIS — M25611 Stiffness of right shoulder, not elsewhere classified: Secondary | ICD-10-CM | POA: Diagnosis not present

## 2019-09-07 DIAGNOSIS — M75111 Incomplete rotator cuff tear or rupture of right shoulder, not specified as traumatic: Secondary | ICD-10-CM | POA: Diagnosis not present

## 2019-09-07 DIAGNOSIS — M25511 Pain in right shoulder: Secondary | ICD-10-CM | POA: Diagnosis not present

## 2019-09-07 DIAGNOSIS — M6281 Muscle weakness (generalized): Secondary | ICD-10-CM | POA: Diagnosis not present

## 2019-09-15 DIAGNOSIS — M7501 Adhesive capsulitis of right shoulder: Secondary | ICD-10-CM | POA: Diagnosis not present

## 2019-09-15 DIAGNOSIS — M25611 Stiffness of right shoulder, not elsewhere classified: Secondary | ICD-10-CM | POA: Diagnosis not present

## 2019-09-15 DIAGNOSIS — M75111 Incomplete rotator cuff tear or rupture of right shoulder, not specified as traumatic: Secondary | ICD-10-CM | POA: Diagnosis not present

## 2019-09-15 DIAGNOSIS — M6281 Muscle weakness (generalized): Secondary | ICD-10-CM | POA: Diagnosis not present

## 2019-09-20 DIAGNOSIS — M6281 Muscle weakness (generalized): Secondary | ICD-10-CM | POA: Diagnosis not present

## 2019-09-20 DIAGNOSIS — M25611 Stiffness of right shoulder, not elsewhere classified: Secondary | ICD-10-CM | POA: Diagnosis not present

## 2019-09-20 DIAGNOSIS — M75111 Incomplete rotator cuff tear or rupture of right shoulder, not specified as traumatic: Secondary | ICD-10-CM | POA: Diagnosis not present

## 2019-09-20 DIAGNOSIS — M25511 Pain in right shoulder: Secondary | ICD-10-CM | POA: Diagnosis not present

## 2019-09-22 DIAGNOSIS — M25611 Stiffness of right shoulder, not elsewhere classified: Secondary | ICD-10-CM | POA: Diagnosis not present

## 2019-09-22 DIAGNOSIS — M75111 Incomplete rotator cuff tear or rupture of right shoulder, not specified as traumatic: Secondary | ICD-10-CM | POA: Diagnosis not present

## 2019-09-22 DIAGNOSIS — M6281 Muscle weakness (generalized): Secondary | ICD-10-CM | POA: Diagnosis not present

## 2019-09-22 DIAGNOSIS — M25511 Pain in right shoulder: Secondary | ICD-10-CM | POA: Diagnosis not present

## 2019-09-26 DIAGNOSIS — M25511 Pain in right shoulder: Secondary | ICD-10-CM | POA: Diagnosis not present

## 2019-10-14 DIAGNOSIS — E785 Hyperlipidemia, unspecified: Secondary | ICD-10-CM | POA: Diagnosis not present

## 2019-10-14 DIAGNOSIS — I10 Essential (primary) hypertension: Secondary | ICD-10-CM | POA: Diagnosis not present

## 2019-10-14 DIAGNOSIS — E1169 Type 2 diabetes mellitus with other specified complication: Secondary | ICD-10-CM | POA: Diagnosis not present

## 2019-10-14 DIAGNOSIS — M130 Polyarthritis, unspecified: Secondary | ICD-10-CM | POA: Diagnosis not present

## 2019-10-25 ENCOUNTER — Ambulatory Visit: Payer: Self-pay | Admitting: Cardiology

## 2019-11-08 ENCOUNTER — Ambulatory Visit: Payer: BC Managed Care – PPO | Admitting: Cardiology

## 2019-11-08 ENCOUNTER — Encounter: Payer: Self-pay | Admitting: Cardiology

## 2019-11-08 ENCOUNTER — Other Ambulatory Visit: Payer: Self-pay

## 2019-11-08 VITALS — BP 107/60 | HR 76 | Resp 16 | Ht 63.0 in | Wt 226.0 lb

## 2019-11-08 DIAGNOSIS — Z01810 Encounter for preprocedural cardiovascular examination: Secondary | ICD-10-CM | POA: Diagnosis not present

## 2019-11-08 DIAGNOSIS — Z8673 Personal history of transient ischemic attack (TIA), and cerebral infarction without residual deficits: Secondary | ICD-10-CM

## 2019-11-08 DIAGNOSIS — I10 Essential (primary) hypertension: Secondary | ICD-10-CM

## 2019-11-08 DIAGNOSIS — Z6841 Body Mass Index (BMI) 40.0 and over, adult: Secondary | ICD-10-CM

## 2019-11-08 DIAGNOSIS — E119 Type 2 diabetes mellitus without complications: Secondary | ICD-10-CM | POA: Diagnosis not present

## 2019-11-08 DIAGNOSIS — R9431 Abnormal electrocardiogram [ECG] [EKG]: Secondary | ICD-10-CM

## 2019-11-08 NOTE — Progress Notes (Signed)
Date:  11/08/2019   ID:  [name], DOB 02-May-1960, MRN [id number]  PCP:  Renaye Rakers, MD  Cardiologist:  Tessa Lerner, DO, [facility] (established care 11/08/2019)  REASON FOR CONSULT: Preoperative risk stratification  REQUESTING PHYSICIAN:  Renaye Rakers, MD [address] [place],  [place] [address]  Chief Complaint  Patient presents with  . Medical Clearance    RT ARM SURGERY  . New Patient (Initial Visit)    HPI  [name] is a 59 y.o. female who presents to the office with a chief complaint of " preop clearance." She is referred to the office at the request of Renaye Rakers, MD. Patient's past medical history and cardiovascular risk factors include: Hypertension, hyperlipidemia,  transient ischemic attack, non-insulin-dependent diabetes mellitus type 2, obesity due to excess calories.  Patient is referred to the office for preoperative risk stratification at the request of her primary care physician.  I am seeing her for the first time for the above-mentioned chief complaint.  Patient states that she is scheduled to have a right shoulder surgery/scope with Dr. Madelon Lips.  The date is to be determined.  Patient does not know the type of anesthesia that is tentatively being planned.  Currently patient denies any chest pain at rest or with effort related activities.  No history of angina and/or arrhythmia.  Patient is currently euvolemic and not in congestive heart failure.  Patient has had surgery in the past without any complications with anesthesia, per patient.  Patient is a diabetic but not on insulin.  She has a history of TIA.  She is currently on aspirin secondary to her history of TIA.   Patient's overall functional status is limited.  Denies prior history of coronary artery disease, myocardial infarction, congestive heart failure, deep venous thrombosis, pulmonary embolism, stroke.  FUNCTIONAL STATUS: No structured exercise program or daily routine.    ALLERGIES: No Known  Allergies  MEDICATION LIST PRIOR TO VISIT: Current Meds  Medication Sig  . amLODipine (NORVASC) 10 MG tablet Take 10 mg by mouth daily.  Marland Kitchen aspirin EC 325 MG tablet Take 325 mg by mouth daily. Swallow whole.   . irbesartan-hydrochlorothiazide (AVALIDE) 300-12.5 MG tablet Take 1 tablet by mouth daily.  Marland Kitchen OZEMPIC, 1 MG/DOSE, 2 MG/1.5ML SOPN Inject 1 mL into the skin once a week.  . Potassium Chloride ER 20 MEQ TBCR Take 1 tablet by mouth daily.  . rosuvastatin (CRESTOR) 20 MG tablet Take 20 mg by mouth daily.  Marland Kitchen SYNJARDY XR 25-1000 MG TB24 Take 1 tablet by mouth daily.  . traMADol (ULTRAM) 50 MG tablet Take 1 tablet by mouth daily as needed.   . [DISCONTINUED] naproxen (NAPROSYN) 500 MG tablet Take 1 tablet (500 mg total) by mouth 2 (two) times daily.     PAST MEDICAL HISTORY: Past Medical History:  Diagnosis Date  . Diabetes mellitus without complication (HCC)   . Hyperlipidemia   . Hypertension   . Obesity   . TIA (transient ischemic attack)     PAST SURGICAL HISTORY: Past Surgical History:  Procedure Laterality Date  . ABDOMINAL HYSTERECTOMY      FAMILY HISTORY: The patient family history includes Diabetes in her mother; Hypertension in her mother.  SOCIAL HISTORY:  The patient  reports that she has never smoked. She has never used smokeless tobacco. She reports that she does not drink alcohol and does not use drugs.  REVIEW OF SYSTEMS: Review of Systems  Constitutional: Negative for chills and  fever.  HENT: Negative for hoarse voice and nosebleeds.   Eyes: Negative for discharge, double vision and pain.  Cardiovascular: Negative for chest pain, claudication, dyspnea on exertion, leg swelling, near-syncope, orthopnea, palpitations, paroxysmal nocturnal dyspnea and syncope.  Respiratory: Negative for hemoptysis and shortness of breath.   Musculoskeletal: Negative for muscle cramps and myalgias.  Gastrointestinal: Negative for abdominal pain, constipation, diarrhea,  hematemesis, hematochezia, melena, nausea and vomiting.  Neurological: Negative for dizziness and light-headedness.   PHYSICAL EXAM: Vitals with BMI 11/08/2019 07/28/2016 07/28/2016  Height 5\' 3"  - 5\' 3"   Weight 226 lbs - 228 lbs  BMI 40.04 - 40.5  Systolic 107 125  Diastolic 60 87 83  Pulse 76 79 109  Some encounter information is confidential and restricted. Go to Review Flowsheets activity to see all data.   CONSTITUTIONAL: Well-developed and well-nourished. No acute distress.  SKIN: Skin is warm and dry. No rash noted. No cyanosis. No pallor. No jaundice HEAD: Normocephalic and atraumatic.  EYES: No scleral icterus MOUTH/THROAT: Moist oral membranes.  NECK: No JVD present. No thyromegaly noted. No carotid bruits  LYMPHATIC: No visible cervical adenopathy.  CHEST Normal respiratory effort. No intercostal retractions  LUNGS: Clear to auscultation bilaterally.  No stridor. No wheezes. No rales.  CARDIOVASCULAR: Regular rate and rhythm, positive S1-S2, no murmurs rubs or gallops appreciated. ABDOMINAL: Obese, soft, nondistended, positive bowel sounds in all 4 quadrants.  No apparent ascites.  EXTREMITIES: No peripheral edema  HEMATOLOGIC: No significant bruising NEUROLOGIC: Oriented to person, place, and time. Nonfocal. Normal muscle tone.  PSYCHIATRIC: Normal mood and affect. Normal behavior. Cooperative  CARDIAC DATABASE: EKG: 11/08/2019: Normal sinus rhythm, 75 bpm, poor R wave progression, T wave inversions in the inferior and lateral leads suggestive of ischemia, without underlying injury pattern.  No significant change compared to prior EKG dated 12/13/2015.  Echocardiogram: None  Stress Testing: None  Heart Catheterization: None  LABORATORY DATA: No recent labs for review.  IMPRESSION:    ICD-10-CM   1. Pre-operative cardiovascular examination  Z01.810 PCV ECHOCARDIOGRAM COMPLETE    PCV MYOCARDIAL PERFUSION WO LEXISCAN  2. Essential hypertension  I10 EKG  12-Lead  3. Non-insulin dependent type 2 diabetes mellitus (HCC)  E11.9   4. Hx-TIA (transient ischemic attack)  Z86.73   5. Nonspecific abnormal electrocardiogram (ECG) (EKG)  R94.31 PCV MYOCARDIAL PERFUSION WO LEXISCAN  6. Class 3 severe obesity due to excess calories with serious comorbidity and body mass index (BMI) of 40.0 to 44.9 in adult [name])  E66.01    Z68.41      RECOMMENDATIONS: [name] is a 59 y.o. female whose past medical history and cardiac risk factors include: Hypertension, hyperlipidemia,  transient ischemic attack, non-insulin-dependent diabetes mellitus type 2, obesity due to excess calories.  Preoperative risk stratification:  Patient is being scheduled for right shoulder surgery.  We will request records from Dr. [name] office in regards to the type of surgery and the anesthesia that is being planned.  Not sure if patient can perform 4 METS of activity as per her daily functional status.  She also has EKG changes that have been relatively stable compared to prior ECGs.  Given her multiple cardiovascular risk factors recommended ischemic evaluation prior to the planned noncardiac surgery.  Echocardiogram will be ordered to evaluate for structural heart disease and left ventricular systolic function.  Nuclear stress test recommended to evaluate for reversible ischemia.  Preoperative risk stratification forthcoming.  Patient states that she had recent blood work with her PCP,  records requested.  Benign essential hypertension:  Patient's office blood pressures is well controlled and is asymptomatic.  Medications reconciled.  Recommended low-salt diet.    Currently managed by primary care provider.  Non-insulin-dependent diabetes mellitus: Educated on the importance of glycemic control.  Currently managed by primary care provider.  Obesity, due to excess calories: Body mass index is 40.03 kg/m. . I reviewed with the patient the importance of diet,  regular physical activity/exercise, weight loss.   . Patient is educated on increasing physical activity gradually as tolerated.  With the goal of moderate intensity exercise for 30 minutes a day 5 days a week.  FINAL MEDICATION LIST END OF ENCOUNTER: No orders of the defined types were placed in this encounter.    Current Outpatient Medications:  .  amLODipine (NORVASC) 10 MG tablet, Take 10 mg by mouth daily., Disp: , Rfl:  .  aspirin EC 325 MG tablet, Take 325 mg by mouth daily. Swallow whole. , Disp: , Rfl:  .  irbesartan-hydrochlorothiazide (AVALIDE) 300-12.5 MG tablet, Take 1 tablet by mouth daily., Disp: , Rfl:  .  OZEMPIC, 1 MG/DOSE, 2 MG/1.5ML SOPN, Inject 1 mL into the skin once a week., Disp: , Rfl:  .  Potassium Chloride ER 20 MEQ TBCR, Take 1 tablet by mouth daily., Disp: , Rfl:  .  rosuvastatin (CRESTOR) 20 MG tablet, Take 20 mg by mouth daily., Disp: , Rfl:  .  SYNJARDY XR 25-1000 MG TB24, Take 1 tablet by mouth daily., Disp: , Rfl:  .  traMADol (ULTRAM) 50 MG tablet, Take 1 tablet by mouth daily as needed. , Disp: , Rfl:   Orders Placed This Encounter  Procedures  . PCV MYOCARDIAL PERFUSION WO LEXISCAN  . EKG 12-Lead  . PCV ECHOCARDIOGRAM COMPLETE    There are no Patient Instructions on file for this visit.   --Continue cardiac medications as reconciled in final medication list. --Return in about 3 months (around 02/08/2020) for Post-surgery follow up and review test results.. Or sooner if needed. --Continue follow-up with your primary care physician regarding the management of your other chronic comorbid conditions.  Patient's questions and concerns were addressed to her satisfaction. She voices understanding of the instructions provided during this encounter.   This note was created using a voice recognition software as a result there may be grammatical errors inadvertently enclosed that do not reflect the nature of this encounter. Every attempt is made to correct  such errors.  Tessa Lerner, [place], [facility]  Pager: [phone] Office: [phone]

## 2019-11-09 ENCOUNTER — Telehealth: Payer: Self-pay | Admitting: Cardiology

## 2019-11-09 NOTE — Telephone Encounter (Signed)
External Labs: Collected: 10/14/2019 Creatinine 0.73 mg/dL. eGFR: 141m/min per 1.73 m Lipid profile: Total cholesterol 172, triglycerides 130, HDL 51, LDL 98, non-HDL 121 Hemoglobin 13.5 g/dL. Hemoglobin A1c: 8.4  She is being scheduled for right shoulder scope with manipulation.  Tentatively scheduled for General anesthesia with scalene block.  SRex Kras DN[facility] Pager: [phone]: [phone]

## 2019-11-14 DIAGNOSIS — E6609 Other obesity due to excess calories: Secondary | ICD-10-CM | POA: Diagnosis not present

## 2019-11-14 DIAGNOSIS — E1169 Type 2 diabetes mellitus with other specified complication: Secondary | ICD-10-CM | POA: Diagnosis not present

## 2019-11-14 DIAGNOSIS — I10 Essential (primary) hypertension: Secondary | ICD-10-CM | POA: Diagnosis not present

## 2019-11-18 ENCOUNTER — Other Ambulatory Visit: Payer: Self-pay

## 2019-11-18 ENCOUNTER — Ambulatory Visit: Payer: BC Managed Care – PPO

## 2019-11-18 DIAGNOSIS — Z01810 Encounter for preprocedural cardiovascular examination: Secondary | ICD-10-CM

## 2019-12-01 DIAGNOSIS — Z20828 Contact with and (suspected) exposure to other viral communicable diseases: Secondary | ICD-10-CM | POA: Diagnosis not present

## 2019-12-05 ENCOUNTER — Other Ambulatory Visit: Payer: BC Managed Care – PPO

## 2019-12-12 ENCOUNTER — Other Ambulatory Visit: Payer: Self-pay

## 2019-12-12 ENCOUNTER — Ambulatory Visit: Payer: BC Managed Care – PPO

## 2019-12-12 DIAGNOSIS — R9431 Abnormal electrocardiogram [ECG] [EKG]: Secondary | ICD-10-CM

## 2019-12-12 DIAGNOSIS — Z01810 Encounter for preprocedural cardiovascular examination: Secondary | ICD-10-CM | POA: Diagnosis not present

## 2019-12-15 DIAGNOSIS — I10 Essential (primary) hypertension: Secondary | ICD-10-CM | POA: Diagnosis not present

## 2019-12-15 DIAGNOSIS — E1169 Type 2 diabetes mellitus with other specified complication: Secondary | ICD-10-CM | POA: Diagnosis not present

## 2019-12-20 ENCOUNTER — Other Ambulatory Visit: Payer: Self-pay | Admitting: Family Medicine

## 2019-12-20 DIAGNOSIS — Z1231 Encounter for screening mammogram for malignant neoplasm of breast: Secondary | ICD-10-CM

## 2019-12-20 NOTE — Progress Notes (Signed)
Pt returned call. Appt moved up to 12/22/19.//ah

## 2019-12-20 NOTE — Progress Notes (Signed)
Patient did not answer left a voicemail  Sch/rma

## 2019-12-22 ENCOUNTER — Ambulatory Visit: Payer: BC Managed Care – PPO | Admitting: Cardiology

## 2019-12-22 ENCOUNTER — Encounter: Payer: Self-pay | Admitting: Cardiology

## 2019-12-22 ENCOUNTER — Other Ambulatory Visit: Payer: Self-pay

## 2019-12-22 VITALS — BP 133/80 | HR 78 | Resp 16 | Ht 63.0 in | Wt 226.0 lb

## 2019-12-22 DIAGNOSIS — R9439 Abnormal result of other cardiovascular function study: Secondary | ICD-10-CM | POA: Diagnosis not present

## 2019-12-22 DIAGNOSIS — E119 Type 2 diabetes mellitus without complications: Secondary | ICD-10-CM | POA: Diagnosis not present

## 2019-12-22 DIAGNOSIS — Z8673 Personal history of transient ischemic attack (TIA), and cerebral infarction without residual deficits: Secondary | ICD-10-CM

## 2019-12-22 DIAGNOSIS — I10 Essential (primary) hypertension: Secondary | ICD-10-CM

## 2019-12-22 DIAGNOSIS — E7800 Pure hypercholesterolemia, unspecified: Secondary | ICD-10-CM

## 2019-12-22 DIAGNOSIS — Z6841 Body Mass Index (BMI) 40.0 and over, adult: Secondary | ICD-10-CM

## 2019-12-22 MED ORDER — NITROGLYCERIN 0.4 MG SL SUBL: 0.4000 mg | SUBLINGUAL_TABLET | SUBLINGUAL | 0 refills | Status: AC | PRN

## 2019-12-22 NOTE — Progress Notes (Signed)
Date:  12/22/2019   ID:  [name], DOB 09/30/1960, MRN [id number]  PCP:  Renaye Rakers, MD  Cardiologist:  Tessa Lerner, DO, [facility] (established care 11/08/2019)  Date: 12/22/19 Last Office Visit: 11/08/2019  Chief Complaint  Patient presents with  . Results    labs  . Follow-up    HPI  [name] is a 59 y.o. female who presents to the office with a chief complaint of "Preoperative stratification and review test results." Patient's past medical history and cardiovascular risk factors include: Hypertension, hyperlipidemia,  transient ischemic attack, non-insulin-dependent diabetes mellitus type 2, obesity due to excess calories.  Patient was referred to the office back in July 2021 for preoperative risk stratification at the request of her PCP.  Patient was planning to undergo a right shoulder scope with manipulation under general anesthesia with scalene block.  However, was referred to cardiology for preoperative stratification.  Patient has multiple cardiovascular risk factors and unable to do 4 METS of activity.  Therefore patient was recommended to undergo echo and stress test for further risk stratification.  Patient did undergo an echocardiogram which noted preserved left ventricular systolic function with no significant valvular heart disease.  However, her nuclear stress test was concerning for reversible ischemia in the RCA/LCx distribution.  She also had low exercise tolerance noted on stress test.  I spoke to her in great detail and also reviewed the images of the stress test with her at today's office visit.  I explained to her that she may have underlying obstructive CAD given the stress test results; however, cannot rule out false positive study as she does have pendulous breasts and increased soft tissue (BMI 40).  In addition, patient is not very active to elicit effort related chest pain or shortness of breath.  Patient states that she is scheduled to have a right shoulder  scope with Dr. Madelon Lips.  The date is to be determined.  Patient is currently euvolemic and not in congestive heart failure.  Patient has had surgery in the past without any complications with anesthesia, per patient.  Patient is a diabetic but not on insulin.  She has a history of TIA.  She is currently on aspirin secondary to her history of TIA.    Denies prior history of coronary artery disease, myocardial infarction, congestive heart failure, deep venous thrombosis, pulmonary embolism, stroke.  FUNCTIONAL STATUS: No structured exercise program or daily routine.    ALLERGIES: No Known Allergies  MEDICATION LIST PRIOR TO VISIT: Current Meds  Medication Sig  . amLODipine (NORVASC) 10 MG tablet Take 10 mg by mouth daily.  Marland Kitchen aspirin EC 325 MG tablet Take 325 mg by mouth daily. Swallow whole.   . diclofenac Sodium (VOLTAREN) 1 % GEL Apply topically as needed.  . irbesartan-hydrochlorothiazide (AVALIDE) 300-12.5 MG tablet Take 1 tablet by mouth daily.  Marland Kitchen OZEMPIC, 1 MG/DOSE, 2 MG/1.5ML SOPN Inject 1 mL into the skin once a week.  . Potassium Chloride ER 20 MEQ TBCR Take 1 tablet by mouth daily.  . rosuvastatin (CRESTOR) 20 MG tablet Take 20 mg by mouth daily.  Marland Kitchen SYNJARDY XR 25-1000 MG TB24 Take 1 tablet by mouth daily.  . traMADol (ULTRAM) 50 MG tablet Take 1 tablet by mouth daily as needed.      PAST MEDICAL HISTORY: Past Medical History:  Diagnosis Date  . Diabetes mellitus without complication (HCC)   . Hyperlipidemia   . Hypertension   . Obesity   . TIA (transient  ischemic attack)     PAST SURGICAL HISTORY: Past Surgical History:  Procedure Laterality Date  . ABDOMINAL HYSTERECTOMY      FAMILY HISTORY: The patient family history includes Diabetes in her mother; Hypertension in her mother.  SOCIAL HISTORY:  The patient  reports that she has never smoked. She has never used smokeless tobacco. She reports that she does not drink alcohol and does not use drugs.  REVIEW OF  SYSTEMS: Review of Systems  Constitutional: Negative for chills and fever.  HENT: Negative for hoarse voice and nosebleeds.   Eyes: Negative for discharge, double vision and pain.  Cardiovascular: Negative for chest pain, claudication, dyspnea on exertion, leg swelling, near-syncope, orthopnea, palpitations, paroxysmal nocturnal dyspnea and syncope.  Respiratory: Negative for hemoptysis and shortness of breath.   Musculoskeletal: Negative for muscle cramps and myalgias.  Gastrointestinal: Negative for abdominal pain, constipation, diarrhea, hematemesis, hematochezia, melena, nausea and vomiting.  Neurological: Negative for dizziness and light-headedness.   PHYSICAL EXAM: Vitals with BMI 12/22/2019 11/08/2019 07/28/2016  Height 5\' 3"  5\' 3"  -  Weight 226 lbs 226 lbs -  BMI 40.04 40.04 -  Systolic 133 107  Diastolic 80 60 87  Pulse 78 76 79  Some encounter information is confidential and restricted. Go to Review Flowsheets activity to see all data.   CONSTITUTIONAL: Well-developed and well-nourished. No acute distress.  SKIN: Skin is warm and dry. No rash noted. No cyanosis. No pallor. No jaundice HEAD: Normocephalic and atraumatic.  EYES: No scleral icterus MOUTH/THROAT: Moist oral membranes.  NECK: No JVD present. No thyromegaly noted. No carotid bruits  LYMPHATIC: No visible cervical adenopathy.  CHEST Normal respiratory effort. No intercostal retractions  LUNGS: Clear to auscultation bilaterally.  No stridor. No wheezes. No rales.  CARDIOVASCULAR: Regular rate and rhythm, positive S1-S2, no murmurs rubs or gallops appreciated. ABDOMINAL: Obese, soft, nondistended, positive bowel sounds in all 4 quadrants.  No apparent ascites.  EXTREMITIES: No peripheral edema  HEMATOLOGIC: No significant bruising NEUROLOGIC: Oriented to person, place, and time. Nonfocal. Normal muscle tone.  PSYCHIATRIC: Normal mood and affect. Normal behavior. Cooperative  CARDIAC DATABASE: EKG: 11/08/2019:  Normal sinus rhythm, 75 bpm, poor R wave progression, T wave inversions in the inferior and lateral leads suggestive of ischemia, without underlying injury pattern.  No significant change compared to prior EKG dated 12/13/2015.  Echocardiogram: 11/18/2019:  Normal LV systolic function with visual EF 60-65%. Left ventricle cavity is normal in size. Normal global wall motion. Mild left ventricular hypertrophy. Normal diastolic filling pattern, normal LAP. Calculated EF 68%.  Mild tricuspid regurgitation. No evidence of pulmonary hypertension.  No other significant valvular abnormalities.  No prior study for comparison.  Stress Testing: Exercise Sestamibi Stress Test 12/12/2019: Normal ECG stress. The patient exercised for 4 minutes and 59 seconds of a Bruce protocol, achieving approximately 7.01 METs. Exercise was terminated due to fatigue/weakness.  Exercise capacity is reduced with accelerated HR response. Normal BP response.   Moderate degree medium extent fixed perfusion defect located in the  inferior wall of left ventricle most probably represents breast tissue attenuation. Minimal ischemic in this region cannot be excluded.  Gated SPECT imaging of the left ventricle was normal. All segments of left ventricle demonstrated normal wall motion and thickening. No stress lung uptake. TID is normal. Stress LV EF is normal 64%.  Low risk. Comparison 12/30/2010: Lexiscan stress: Normal perfusion.   Heart Catheterization: None  LABORATORY DATA: No recent labs for review.  IMPRESSION:    ICD-10-CM  1. Abnormal nuclear stress test  R94.39 nitroGLYCERIN (NITROSTAT) 0.4 MG SL tablet    CBC    BASIC METABOLIC PANEL WITH GFR  2. Essential hypertension  I10   3. Non-insulin dependent type 2 diabetes mellitus (HCC)  E11.9   4. Hx-TIA (transient ischemic attack)  Z86.73   5. Hypercholesterolemia  E78.00   6. Class 3 severe obesity due to excess calories with serious comorbidity and body mass index  (BMI) of 40.0 to 44.9 in adult [facility])  E66.01    Z68.41      RECOMMENDATIONS: [name] is a 59 y.o. female whose past medical history and cardiac risk factors include: Hypertension, hyperlipidemia,  transient ischemic attack, non-insulin-dependent diabetes mellitus type 2, obesity due to excess calories.  Abnormal nuclear stress test:  I reviewed the nuclear stress test images with the patient as it does show reversible ischemia in the distal LCx/RCA distribution.  Images independently reviewed with the patient at today's office visit.  Clinically patient is asymptomatic; however I suspect that she is not very active at baseline to elicit effort related chest pain or shortness of breath.  She has multiple cardiovascular risk factors including diabetes mellitus and history of stroke and therefore recommended additional work-up to rule out underlying obstructive CAD prior to elective surgery.  Recommended either left heart catheterization or CCTA for further evaluation.  The left heart catheterization procedure was explained to the patient in detail. The indication, alternatives, risks and benefits were reviewed. Complications including but not limited to bleeding, infection, acute kidney injury, blood transfusion, heart rhythm disturbances, contrast (dye) reaction, damage to the arteries or nerves in the legs or hands, cerebrovascular accident, myocardial infarction, need for emergent bypass surgery, blood clots in the legs, possible need for emergent blood transfusion, and rarely death were reviewed and discussed with the patient. The patient voices understanding and wants to think about this prior to making the decision.   Nitro to use prn and medication profile discussed with the patient.   Preoperative risk stratification: Recommendations pending.   Benign essential hypertension:  Patient's office blood pressures is well controlled and is asymptomatic.  Medications  reconciled.  Recommended low-salt diet.    Currently managed by primary care provider.  Non-insulin-dependent diabetes mellitus: Educated on the importance of glycemic control.  Currently managed by primary care provider.  Obesity, due to excess calories: Body mass index is 40.03 kg/m. . I reviewed with the patient the importance of diet, regular physical activity/exercise, weight loss.   . Continue with the current activity level.  Until further cardiac evaluation is not performed would not recommend uptitrating physical activity.  FINAL MEDICATION LIST END OF ENCOUNTER: Meds ordered this encounter  Medications  . nitroGLYCERIN (NITROSTAT) 0.4 MG SL tablet    Sig: Place 1 tablet (0.4 mg total) under the tongue every 5 (five) minutes as needed for chest pain. If you require more than two tablets five minutes apart go to the nearest ER via EMS.    Dispense:  30 tablet    Refill:  0     Current Outpatient Medications:  .  amLODipine (NORVASC) 10 MG tablet, Take 10 mg by mouth daily., Disp: , Rfl:  .  aspirin EC 325 MG tablet, Take 325 mg by mouth daily. Swallow whole. , Disp: , Rfl:  .  diclofenac Sodium (VOLTAREN) 1 % GEL, Apply topically as needed., Disp: , Rfl:  .  irbesartan-hydrochlorothiazide (AVALIDE) 300-12.5 MG tablet, Take 1 tablet by mouth daily., Disp: ,  Rfl:  .  OZEMPIC, 1 MG/DOSE, 2 MG/1.5ML SOPN, Inject 1 mL into the skin once a week., Disp: , Rfl:  .  Potassium Chloride ER 20 MEQ TBCR, Take 1 tablet by mouth daily., Disp: , Rfl:  .  rosuvastatin (CRESTOR) 20 MG tablet, Take 20 mg by mouth daily., Disp: , Rfl:  .  SYNJARDY XR 25-1000 MG TB24, Take 1 tablet by mouth daily., Disp: , Rfl:  .  traMADol (ULTRAM) 50 MG tablet, Take 1 tablet by mouth daily as needed. , Disp: , Rfl:  .  nitroGLYCERIN (NITROSTAT) 0.4 MG SL tablet, Place 1 tablet (0.4 mg total) under the tongue every 5 (five) minutes as needed for chest pain. If you require more than two tablets five minutes apart  go to the nearest ER via EMS., Disp: 30 tablet, Rfl: 0  Orders Placed This Encounter  Procedures  . CBC  . BASIC METABOLIC PANEL WITH GFR    There are no Patient Instructions on file for this visit.   --Continue cardiac medications as reconciled in final medication list. --Return in about 4 weeks (around 01/19/2020) for Reevaluation of symptoms recently abnormal stress test. . Or sooner if needed. --Continue follow-up with your primary care physician regarding the management of your other chronic comorbid conditions.  Patient's questions and concerns were addressed to her satisfaction. She voices understanding of the instructions provided during this encounter.   This note was created using a voice recognition software as a result there may be grammatical errors inadvertently enclosed that do not reflect the nature of this encounter. Every attempt is made to correct such errors.  Total time spent: 35 minutes.  Tessa LernerSunit Ariv Penrod, [place]DO, [facility] ClinicFACC  Pager: [phone] Office: [phone]

## 2020-01-16 DIAGNOSIS — E1169 Type 2 diabetes mellitus with other specified complication: Secondary | ICD-10-CM | POA: Diagnosis not present

## 2020-01-16 DIAGNOSIS — M1310 Monoarthritis, not elsewhere classified, unspecified site: Secondary | ICD-10-CM | POA: Diagnosis not present

## 2020-01-16 DIAGNOSIS — E669 Obesity, unspecified: Secondary | ICD-10-CM | POA: Diagnosis not present

## 2020-01-16 DIAGNOSIS — Z23 Encounter for immunization: Secondary | ICD-10-CM | POA: Diagnosis not present

## 2020-01-16 DIAGNOSIS — E1129 Type 2 diabetes mellitus with other diabetic kidney complication: Secondary | ICD-10-CM | POA: Diagnosis not present

## 2020-01-18 DIAGNOSIS — R9439 Abnormal result of other cardiovascular function study: Secondary | ICD-10-CM | POA: Diagnosis not present

## 2020-01-19 LAB — CBC
Hematocrit: 41.6 % (ref 34.0–46.6)
Hemoglobin: 14.2 g/dL (ref 11.1–15.9)
MCH: 28.3 pg (ref 26.6–33.0)
MCHC: 34.1 g/dL (ref 31.5–35.7)
MCV: 83 fL (ref 79–97)
Platelets: 357 10*3/uL (ref 150–450)
RBC: 5.02 x10E6/uL (ref 3.77–5.28)
RDW: 13.3 % (ref 11.7–15.4)
WBC: 7.3 10*3/uL (ref 3.4–10.8)

## 2020-01-20 ENCOUNTER — Inpatient Hospital Stay (HOSPITAL_COMMUNITY): Admission: RE | Admit: 2020-01-20 | Payer: Self-pay | Source: Ambulatory Visit

## 2020-01-21 ENCOUNTER — Other Ambulatory Visit (HOSPITAL_COMMUNITY)
Admission: RE | Admit: 2020-01-21 | Discharge: 2020-01-21 | Disposition: A | Discharge status: Home / Self Care | Payer: BC Managed Care – PPO | Source: Ambulatory Visit | Attending: Cardiology | Admitting: Cardiology

## 2020-01-21 DIAGNOSIS — Z01812 Encounter for preprocedural laboratory examination: Secondary | ICD-10-CM | POA: Diagnosis not present

## 2020-01-21 DIAGNOSIS — Z20822 Contact with and (suspected) exposure to covid-19: Secondary | ICD-10-CM | POA: Insufficient documentation

## 2020-01-21 LAB — SARS CORONAVIRUS 2 (TAT 6-24 HRS): SARS Coronavirus 2: NEGATIVE

## 2020-01-23 ENCOUNTER — Ambulatory Visit: Payer: BC Managed Care – PPO | Admitting: Cardiology

## 2020-01-23 DIAGNOSIS — R9439 Abnormal result of other cardiovascular function study: Secondary | ICD-10-CM

## 2020-01-24 ENCOUNTER — Other Ambulatory Visit: Payer: Self-pay

## 2020-01-24 ENCOUNTER — Ambulatory Visit (HOSPITAL_COMMUNITY)
Admission: RE | Admit: 2020-01-24 | Discharge: 2020-01-24 | Disposition: A | Discharge status: Home / Self Care | Payer: BC Managed Care – PPO | Attending: Cardiology | Admitting: Cardiology

## 2020-01-24 ENCOUNTER — Encounter (HOSPITAL_COMMUNITY)
Admission: RE | Disposition: A | Discharge status: Home / Self Care | Payer: Self-pay | Source: Home / Self Care | Attending: Cardiology

## 2020-01-24 DIAGNOSIS — E669 Obesity, unspecified: Secondary | ICD-10-CM | POA: Insufficient documentation

## 2020-01-24 DIAGNOSIS — Z6839 Body mass index (BMI) 39.0-39.9, adult: Secondary | ICD-10-CM | POA: Insufficient documentation

## 2020-01-24 DIAGNOSIS — E119 Type 2 diabetes mellitus without complications: Secondary | ICD-10-CM | POA: Insufficient documentation

## 2020-01-24 DIAGNOSIS — I10 Essential (primary) hypertension: Secondary | ICD-10-CM | POA: Insufficient documentation

## 2020-01-24 DIAGNOSIS — E785 Hyperlipidemia, unspecified: Secondary | ICD-10-CM | POA: Diagnosis not present

## 2020-01-24 DIAGNOSIS — Z833 Family history of diabetes mellitus: Secondary | ICD-10-CM | POA: Diagnosis not present

## 2020-01-24 DIAGNOSIS — Z8249 Family history of ischemic heart disease and other diseases of the circulatory system: Secondary | ICD-10-CM | POA: Insufficient documentation

## 2020-01-24 DIAGNOSIS — Z8673 Personal history of transient ischemic attack (TIA), and cerebral infarction without residual deficits: Secondary | ICD-10-CM | POA: Insufficient documentation

## 2020-01-24 DIAGNOSIS — R9439 Abnormal result of other cardiovascular function study: Secondary | ICD-10-CM | POA: Insufficient documentation

## 2020-01-24 DIAGNOSIS — Z7982 Long term (current) use of aspirin: Secondary | ICD-10-CM | POA: Diagnosis not present

## 2020-01-24 DIAGNOSIS — Z79899 Other long term (current) drug therapy: Secondary | ICD-10-CM | POA: Diagnosis not present

## 2020-01-24 DIAGNOSIS — Z90710 Acquired absence of both cervix and uterus: Secondary | ICD-10-CM | POA: Insufficient documentation

## 2020-01-24 DIAGNOSIS — R0600 Dyspnea, unspecified: Secondary | ICD-10-CM | POA: Insufficient documentation

## 2020-01-24 HISTORY — PX: LEFT HEART CATH AND CORONARY ANGIOGRAPHY: CATH118249

## 2020-01-24 LAB — GLUCOSE, CAPILLARY
Glucose-Capillary: 172 mg/dL — ABNORMAL HIGH (ref 70–99)
Glucose-Capillary: 108 mg/dL — ABNORMAL HIGH (ref 70–99)

## 2020-01-24 LAB — BASIC METABOLIC PANEL
Anion gap: 13 (ref 5–15)
BUN: 18 mg/dL (ref 6–20)
CO2: 25 mmol/L (ref 22–32)
Calcium: 9.7 mg/dL (ref 8.9–10.3)
Chloride: 100 mmol/L (ref 98–111)
Creatinine, Ser: 0.97 mg/dL (ref 0.44–1.00)
GFR, Estimated: >60 mL/min (ref >60)
Glucose, Bld: 165 mg/dL — ABNORMAL HIGH (ref 70–99)
Potassium: 2.7 mmol/L — CL (ref 3.5–5.1)
Sodium: 138 mmol/L (ref 135–145)

## 2020-01-24 SURGERY — LEFT HEART CATH AND CORONARY ANGIOGRAPHY
Anesthesia: LOCAL

## 2020-01-24 MED ORDER — LIDOCAINE HCL (PF) 1 % IJ SOLN
INTRAMUSCULAR | Status: AC
  Filled 2020-01-24: qty 30

## 2020-01-24 MED ORDER — HEPARIN (PORCINE) IN NACL 1000-0.9 UT/500ML-% IV SOLN
INTRAVENOUS | Status: DC | PRN
  Administered 2020-01-24 (×2): 500 mL

## 2020-01-24 MED ORDER — SODIUM CHLORIDE 0.9 % WEIGHT BASED INFUSION
3.0000 mL/kg/h | INTRAVENOUS | Status: AC
  Administered 2020-01-24: 3 mL/kg/h via INTRAVENOUS

## 2020-01-24 MED ORDER — ASPIRIN 81 MG PO CHEW
81.0000 mg | CHEWABLE_TABLET | ORAL | Status: AC
  Administered 2020-01-24: 81 mg via ORAL
  Filled 2020-01-24: qty 1

## 2020-01-24 MED ORDER — VERAPAMIL HCL 2.5 MG/ML IV SOLN
INTRAVENOUS | Status: DC | PRN
  Administered 2020-01-24: 10 mL via INTRA_ARTERIAL

## 2020-01-24 MED ORDER — VERAPAMIL HCL 2.5 MG/ML IV SOLN
INTRAVENOUS | Status: AC
  Filled 2020-01-24: qty 2

## 2020-01-24 MED ORDER — MIDAZOLAM HCL 2 MG/2ML IJ SOLN
INTRAMUSCULAR | Status: DC | PRN
  Administered 2020-01-24: 1 mg via INTRAVENOUS

## 2020-01-24 MED ORDER — IOHEXOL 350 MG/ML SOLN
INTRAVENOUS | Status: DC | PRN
  Administered 2020-01-24: 25 mL via INTRA_ARTERIAL

## 2020-01-24 MED ORDER — SODIUM CHLORIDE 0.9% FLUSH: 3.0000 mL | INTRAVENOUS | Status: DC | PRN

## 2020-01-24 MED ORDER — MIDAZOLAM HCL 2 MG/2ML IJ SOLN
INTRAMUSCULAR | Status: AC
  Filled 2020-01-24: qty 2

## 2020-01-24 MED ORDER — SODIUM CHLORIDE 0.9 % IV SOLN: 250.0000 mL | INTRAVENOUS | Status: DC | PRN

## 2020-01-24 MED ORDER — LIDOCAINE HCL (PF) 1 % IJ SOLN
INTRAMUSCULAR | Status: DC | PRN
  Administered 2020-01-24: 2 mL

## 2020-01-24 MED ORDER — POTASSIUM CHLORIDE 10 MEQ/100ML IV SOLN
10.0000 meq | INTRAVENOUS | Status: AC
  Administered 2020-01-24: 10 meq via INTRAVENOUS

## 2020-01-24 MED ORDER — HEPARIN SODIUM (PORCINE) 1000 UNIT/ML IJ SOLN
INTRAMUSCULAR | Status: AC
  Filled 2020-01-24: qty 1

## 2020-01-24 MED ORDER — HEPARIN (PORCINE) IN NACL 1000-0.9 UT/500ML-% IV SOLN
INTRAVENOUS | Status: AC
  Filled 2020-01-24: qty 1000

## 2020-01-24 MED ORDER — SODIUM CHLORIDE 0.9 % WEIGHT BASED INFUSION: 1.0000 mL/kg/h | INTRAVENOUS | Status: DC

## 2020-01-24 MED ORDER — HEPARIN SODIUM (PORCINE) 1000 UNIT/ML IJ SOLN
INTRAMUSCULAR | Status: DC | PRN
  Administered 2020-01-24: 5000 [IU] via INTRAVENOUS

## 2020-01-24 MED ORDER — POTASSIUM CHLORIDE CRYS ER 20 MEQ PO TBCR
EXTENDED_RELEASE_TABLET | ORAL | Status: AC
  Filled 2020-01-24: qty 2

## 2020-01-24 MED ORDER — POTASSIUM CHLORIDE 10 MEQ/100ML IV SOLN
INTRAVENOUS | Status: AC
  Administered 2020-01-24: 10 meq
  Filled 2020-01-24: qty 100

## 2020-01-24 MED ORDER — POTASSIUM CHLORIDE CRYS ER 20 MEQ PO TBCR
40.0000 meq | EXTENDED_RELEASE_TABLET | ORAL | Status: AC
  Administered 2020-01-24 (×2): 40 meq via ORAL
  Filled 2020-01-24: qty 2

## 2020-01-24 MED ORDER — FENTANYL CITRATE (PF) 100 MCG/2ML IJ SOLN
INTRAMUSCULAR | Status: AC
  Filled 2020-01-24: qty 2

## 2020-01-24 MED ORDER — SODIUM CHLORIDE 0.9% FLUSH: 3.0000 mL | Freq: Two times a day (BID) | INTRAVENOUS | Status: DC

## 2020-01-24 MED ORDER — FENTANYL CITRATE (PF) 100 MCG/2ML IJ SOLN
INTRAMUSCULAR | Status: DC | PRN
Start: 2020-01-24 — End: 2020-01-24
  Administered 2020-01-24: 50 ug via INTRAVENOUS

## 2020-01-24 SURGICAL SUPPLY — 9 items
CATH OPTITORQUE TIG 4.0 5F (CATHETERS) ×1 IMPLANT
DEVICE RAD COMP TR BAND LRG (VASCULAR PRODUCTS) ×2 IMPLANT
GLIDESHEATH SLEND A-KIT 6F 22G (SHEATH) ×1 IMPLANT
GUIDEWIRE INQWIRE 1.5J.035X260 (WIRE) IMPLANT
INQWIRE 1.5J .035X260CM (WIRE) ×2
KIT HEART LEFT (KITS) ×2 IMPLANT
PACK CARDIAC CATHETERIZATION (CUSTOM PROCEDURE TRAY) ×2 IMPLANT
TRANSDUCER W/STOPCOCK (MISCELLANEOUS) ×2 IMPLANT
TUBING CIL FLEX 10 FLL-RA (TUBING) ×2 IMPLANT

## 2020-01-24 NOTE — Discharge Instructions (Signed)
Radial Site Care  This sheet gives you information about how to care for yourself after your procedure. Your health care provider may also give you more specific instructions. If you have problems or questions, contact your health care provider. What can I expect after the procedure? After the procedure, it is common to have:  Bruising and tenderness at the catheter insertion area. Follow these instructions at home: Medicines  Take over-the-counter and prescription medicines only as told by your health care provider. Insertion site care  Follow instructions from your health care provider about how to take care of your insertion site. Make sure you: ? Wash your hands with soap and water before you change your bandage (dressing). If soap and water are not available, use hand sanitizer. ? Change your dressing as told by your health care provider. ? Leave stitches (sutures), skin glue, or adhesive strips in place. These skin closures may need to stay in place for 2 weeks or longer. If adhesive strip edges start to loosen and curl up, you may trim the loose edges. Do not remove adhesive strips completely unless your health care provider tells you to do that.  Check your insertion site every day for signs of infection. Check for: ? Redness, swelling, or pain. ? Fluid or blood. ? Pus or a bad smell. ? Warmth.  Do not take baths, swim, or use a hot tub until your health care provider approves.  You may shower 24-48 hours after the procedure, or as directed by your health care provider. ? Remove the dressing and gently wash the site with plain soap and water. ? Pat the area dry with a clean towel. ? Do not rub the site. That could cause bleeding.  Do not apply powder or lotion to the site. Activity   For 24 hours after the procedure, or as directed by your health care provider: ? Do not flex or bend the affected arm. ? Do not push or pull heavy objects with the affected arm. ? Do not  drive yourself home from the hospital or clinic. You may drive 24 hours after the procedure unless your health care provider tells you not to. ? Do not operate machinery or power tools.  Do not lift anything that is heavier than 10 lb (4.5 kg), or the limit that you are told, until your health care provider says that it is safe.  Ask your health care provider when it is okay to: ? Return to work or school. ? Resume usual physical activities or sports. ? Resume sexual activity. General instructions  If the catheter site starts to bleed, raise your arm and put firm pressure on the site. If the bleeding does not stop, get help right away. This is a medical emergency.  If you went home on the same day as your procedure, a responsible adult should be with you for the first 24 hours after you arrive home.  Keep all follow-up visits as told by your health care provider. This is important. Contact a health care provider if:  You have a fever.  You have redness, swelling, or yellow drainage around your insertion site. Get help right away if:  You have unusual pain at the radial site.  The catheter insertion area swells very fast.  The insertion area is bleeding, and the bleeding does not stop when you hold steady pressure on the area.  Your arm or hand becomes pale, cool, tingly, or numb. These symptoms may represent a serious problem   that is an emergency. Do not wait to see if the symptoms will go away. Get medical help right away. Call your local emergency services (911 in the [place] Do not drive yourself to the hospital. Summary  After the procedure, it is common to have bruising and tenderness at the site.  Follow instructions from your health care provider about how to take care of your radial site wound. Check the wound every day for signs of infection.  Do not lift anything that is heavier than 10 lb (4.5 kg), or the limit that you are told, until your health care provider says  that it is safe. This information is not intended to replace advice given to you by your health care provider. Make sure you discuss any questions you have with your health care provider. Document Revised: 05/06/2017 Document Reviewed: 05/06/2017 Elsevier Patient Education  2020 [employer]  

## 2020-01-24 NOTE — Interval H&P Note (Signed)
History and Physical Interval Note:  01/24/2020 1:00 PM  [name]  has presented today for surgery, with the diagnosis of hypertension, stroke.  The various methods of treatment have been discussed with the patient and family. After consideration of risks, benefits and other options for treatment, the patient has consented to  Procedure(s): LEFT HEART CATH AND CORONARY ANGIOGRAPHY (N/A) as a surgical intervention.  The patient's history has been reviewed, patient examined, no change in status, stable for surgery.  I have reviewed the patient's chart and labs.  Questions were answered to the patient's satisfaction.    of long-acting drugs):  1  Patient undergoing renal transplant:  No  Patient undergoing percutaneous valve procedure:  No    newline 1 Vessel Disease PCI CABG  No proximal LAD involvement, No proximal left dominant LCX involvement M (6); Indication 2 M (4); Indication 2   Proximal left dominant LCX involvement A (7); Indication 5 A (7); Indication 5   Proximal LAD involvement A (7); Indication 5 A (7); Indication 5   newline 2 Vessel Disease  No proximal LAD involvement A (7); Indication 8 M (6); Indication 8   Proximal LAD involvement A (7); Indication 14 A (8); Indication 14   newline 3 Vessel Disease  Low disease complexity (e.g., focal stenoses, SYNTAX <=22) A (7); Indication 19 A (8); Indication 19   Intermediate or high disease complexity (e.g., SYNTAX >=23) M (5); Indication 23 A (8); Indication 23   newline Left Main Disease  Isolated LMCA disease: ostial or midshaft A (7); Indication 24 A (9); Indication 24   Isolated LMCA disease: bifurcation involvement M (5); Indication 25 A (9); Indication 25   LMCA ostial or midshaft, concurrent low disease burden multivessel disease (e.g., 1-2 additional focal stenoses, SYNTAX <=22) A (7); Indication 26 A (9); Indication 26   LMCA ostial or midshaft, concurrent intermediate or high disease burden multivessel disease (e.g.,  1-2 additional bifurcation stenoses, long stenoses, SYNTAX >=23) M (4); Indication 27 A (9); Indication 27   LMCA bifurcation involvement, concurrent low disease burden multivessel disease (e.g., 1-2 additional focal stenoses, SYNTAX <=22) M (5); Indication 28 A (9); Indication 28   LMCA bifurcation involvement, concurrent intermediate or high disease burden multivessel disease (e.g., 1-2 additional bifurcation stenoses, long stenoses, SYNTAX >=23) R (3); Indication 29 A (9); Indication 29     Branae Crail J Kord Monette

## 2020-01-24 NOTE — H&P (Signed)
01/03/2020 OV copied for documentation    Date:  01/24/2020   ID:  VILA DORY, DOB 08-12-1960, MRN [id number]  PCP:  Renaye Rakers, MD  Cardiologist:  Elder Negus, DO, FACC (established care 11/08/2019)  Date: 12/22/19 Last Office Visit: 11/08/2019  No chief complaint on file.   HPI  [name] is a 59 y.o. female who presents to the office with a chief complaint of "Preoperative stratification and review test results." Patient's past medical history and cardiovascular risk factors include: Hypertension, hyperlipidemia,  transient ischemic attack, non-insulin-dependent diabetes mellitus type 2, obesity due to excess calories.  Patient was referred to the office back in July 2021 for preoperative risk stratification at the request of her PCP.  Patient was planning to undergo a right shoulder scope with manipulation under general anesthesia with scalene block.  However, was referred to cardiology for preoperative stratification.  Patient has multiple cardiovascular risk factors and unable to do 4 METS of activity.  Therefore patient was recommended to undergo echo and stress test for further risk stratification.  Patient did undergo an echocardiogram which noted preserved left ventricular systolic function with no significant valvular heart disease.  However, her nuclear stress test was concerning for reversible ischemia in the RCA/LCx distribution.  She also had low exercise tolerance noted on stress test.  I spoke to her in great detail and also reviewed the images of the stress test with her at today's office visit.  I explained to her that she may have underlying obstructive CAD given the stress test results; however, cannot rule out false positive study as she does have pendulous breasts and increased soft tissue (BMI 40).  In addition, patient is not very active to elicit effort related chest pain or shortness of breath.  Patient states that she is scheduled to have a right  shoulder scope with Dr. Madelon Lips.  The date is to be determined.  Patient is currently euvolemic and not in congestive heart failure.  Patient has had surgery in the past without any complications with anesthesia, per patient.  Patient is a diabetic but not on insulin.  She has a history of TIA.  She is currently on aspirin secondary to her history of TIA.    Denies prior history of coronary artery disease, myocardial infarction, congestive heart failure, deep venous thrombosis, pulmonary embolism, stroke.  FUNCTIONAL STATUS: No structured exercise program or daily routine.    ALLERGIES: No Known Allergies  MEDICATION LIST PRIOR TO VISIT: Current Meds  Medication Sig  . amLODipine (NORVASC) 10 MG tablet Take 10 mg by mouth daily.  Marland Kitchen aspirin EC 325 MG tablet Take 325 mg by mouth daily. Swallow whole.   . diclofenac Sodium (VOLTAREN) 1 % GEL Apply 1 application topically daily as needed (pain).   Marland Kitchen ELDERBERRY PO Take 1 capsule by mouth daily.  . irbesartan-hydrochlorothiazide (AVALIDE) 300-12.5 MG tablet Take 1 tablet by mouth daily.  . nitroGLYCERIN (NITROSTAT) 0.4 MG SL tablet Place 1 tablet (0.4 mg total) under the tongue every 5 (five) minutes as needed for chest pain. If you require more than two tablets five minutes apart go to the nearest ER via EMS.  Marland Kitchen OZEMPIC, 1 MG/DOSE, 2 MG/1.5ML SOPN Inject 2 mg into the skin every Tuesday.   . Potassium Chloride ER 20 MEQ TBCR Take 20 mEq by mouth daily.   . rosuvastatin (CRESTOR) 20 MG tablet Take 20 mg by mouth daily.  Marland Kitchen SYNJARDY XR 25-1000 MG TB24 Take 1 tablet by  mouth daily.     PAST MEDICAL HISTORY: Past Medical History:  Diagnosis Date  . Diabetes mellitus without complication (HCC)   . Hyperlipidemia   . Hypertension   . Obesity   . TIA (transient ischemic attack)     PAST SURGICAL HISTORY: Past Surgical History:  Procedure Laterality Date  . ABDOMINAL HYSTERECTOMY      FAMILY HISTORY: The patient family history includes  Diabetes in her mother; Hypertension in her mother.  SOCIAL HISTORY:  The patient  reports that she has never smoked. She has never used smokeless tobacco. She reports that she does not drink alcohol and does not use drugs.  REVIEW OF SYSTEMS: Review of Systems  Constitutional: Negative for chills and fever.  HENT: Negative for hoarse voice and nosebleeds.   Eyes: Negative for discharge, double vision and pain.  Cardiovascular: Negative for chest pain, claudication, dyspnea on exertion, leg swelling, near-syncope, orthopnea, palpitations, paroxysmal nocturnal dyspnea and syncope.  Respiratory: Negative for hemoptysis and shortness of breath.   Musculoskeletal: Negative for muscle cramps and myalgias.  Gastrointestinal: Negative for abdominal pain, constipation, diarrhea, hematemesis, hematochezia, melena, nausea and vomiting.  Neurological: Negative for dizziness and light-headedness.   PHYSICAL EXAM: Vitals with BMI 01/24/2020 12/22/2019 11/08/2019  Height 5\' 3"  5\' 3"  5\' 3"   Weight 225 lbs 226 lbs 226 lbs  BMI 39.87 40.04 40.04  Systolic 110 133  Diastolic 79 80 60  Pulse 90 78 76  Some encounter information is confidential and restricted. Go to Review Flowsheets activity to see all data.   CONSTITUTIONAL: Well-developed and well-nourished. No acute distress.  SKIN: Skin is warm and dry. No rash noted. No cyanosis. No pallor. No jaundice HEAD: Normocephalic and atraumatic.  EYES: No scleral icterus MOUTH/THROAT: Moist oral membranes.  NECK: No JVD present. No thyromegaly noted. No carotid bruits  LYMPHATIC: No visible cervical adenopathy.  CHEST Normal respiratory effort. No intercostal retractions  LUNGS: Clear to auscultation bilaterally.  No stridor. No wheezes. No rales.  CARDIOVASCULAR: Regular rate and rhythm, positive S1-S2, no murmurs rubs or gallops appreciated. ABDOMINAL: Obese, soft, nondistended, positive bowel sounds in all 4 quadrants.  No apparent ascites.    EXTREMITIES: No peripheral edema  HEMATOLOGIC: No significant bruising NEUROLOGIC: Oriented to person, place, and time. Nonfocal. Normal muscle tone.  PSYCHIATRIC: Normal mood and affect. Normal behavior. Cooperative  CARDIAC DATABASE: EKG: 11/08/2019: Normal sinus rhythm, 75 bpm, poor R wave progression, T wave inversions in the inferior and lateral leads suggestive of ischemia, without underlying injury pattern.  No significant change compared to prior EKG dated 12/13/2015.  Echocardiogram: 11/18/2019:  Normal LV systolic function with visual EF 60-65%. Left ventricle cavity is normal in size. Normal global wall motion. Mild left ventricular hypertrophy. Normal diastolic filling pattern, normal LAP. Calculated EF 68%.  Mild tricuspid regurgitation. No evidence of pulmonary hypertension.  No other significant valvular abnormalities.  No prior study for comparison.  Stress Testing: Exercise Sestamibi Stress Test 12/12/2019: Normal ECG stress. The patient exercised for 4 minutes and 59 seconds of a Bruce protocol, achieving approximately 7.01 METs. Exercise was terminated due to fatigue/weakness.  Exercise capacity is reduced with accelerated HR response. Normal BP response.   Moderate degree medium extent fixed perfusion defect located in the  inferior wall of left ventricle most probably represents breast tissue attenuation. Minimal ischemic in this region cannot be excluded.  Gated SPECT imaging of the left ventricle was normal. All segments of left ventricle demonstrated normal wall motion and thickening. No  stress lung uptake. TID is normal. Stress LV EF is normal 64%.  Low risk. Comparison 12/30/2010: Lexiscan stress: Normal perfusion.   Heart Catheterization: None  LABORATORY DATA: No recent labs for review.  IMPRESSION:  No diagnosis found.   RECOMMENDATIONS: [name] is a 59 y.o. female whose past medical history and cardiac risk factors include: Hypertension,  hyperlipidemia,  transient ischemic attack, non-insulin-dependent diabetes mellitus type 2, obesity due to excess calories.  Abnormal nuclear stress test:  I reviewed the nuclear stress test images with the patient as it does show reversible ischemia in the distal LCx/RCA distribution.  Images independently reviewed with the patient at today's office visit.  Clinically patient is asymptomatic; however I suspect that she is not very active at baseline to elicit effort related chest pain or shortness of breath.  She has multiple cardiovascular risk factors including diabetes mellitus and history of stroke and therefore recommended additional work-up to rule out underlying obstructive CAD prior to elective surgery.  Recommended either left heart catheterization or CCTA for further evaluation.  The left heart catheterization procedure was explained to the patient in detail. The indication, alternatives, risks and benefits were reviewed. Complications including but not limited to bleeding, infection, acute kidney injury, blood transfusion, heart rhythm disturbances, contrast (dye) reaction, damage to the arteries or nerves in the legs or hands, cerebrovascular accident, myocardial infarction, need for emergent bypass surgery, blood clots in the legs, possible need for emergent blood transfusion, and rarely death were reviewed and discussed with the patient. The patient voices understanding and wants to think about this prior to making the decision.   Nitro to use prn and medication profile discussed with the patient.   Preoperative risk stratification: Recommendations pending.   Benign essential hypertension:  Patient's office blood pressures is well controlled and is asymptomatic.  Medications reconciled.  Recommended low-salt diet.    Currently managed by primary care provider.  Non-insulin-dependent diabetes mellitus: Educated on the importance of glycemic control.  Currently managed by  primary care provider.  Obesity, due to excess calories: Body mass index is 39.86 kg/m. . I reviewed with the patient the importance of diet, regular physical activity/exercise, weight loss.   . Continue with the current activity level.  Until further cardiac evaluation is not performed would not recommend uptitrating physical activity.  FINAL MEDICATION LIST END OF ENCOUNTER: Meds ordered this encounter  Medications  . sodium chloride flush (NS) 0.9 % injection 3 mL  . sodium chloride flush (NS) 0.9 % injection 3 mL  . 0.9 %  sodium chloride infusion  . aspirin chewable tablet 81 mg  . FOLLOWED BY Linked Order Group   . 0.9% sodium chloride infusion   . 0.9% sodium chloride infusion     Current Facility-Administered Medications:  .  0.9 %  sodium chloride infusion, 250 mL, Intravenous, PRN, Isadora Delorey J, MD .  0.9% sodium chloride infusion, 3 mL/kg/hr, Intravenous, Continuous **FOLLOWED BY** 0.9% sodium chloride infusion, 1 mL/kg/hr, Intravenous, Continuous, Brydan Downard J, MD .  aspirin chewable tablet 81 mg, 81 mg, Oral, Pre-Cath, Tonie Elsey J, MD .  sodium chloride flush (NS) 0.9 % injection 3 mL, 3 mL, Intravenous, Q12H, Jamarrius Salay J, MD .  sodium chloride flush (NS) 0.9 % injection 3 mL, 3 mL, Intravenous, PRN, Anish Vana J, MD  Orders Placed This Encounter  Procedures  . Basic metabolic panel  . Glucose, capillary  . Diet NPO time specified  . Informed Consent Details: Physician/Practitioner Attestation; Transcribe  to consent form and obtain patient signature  . Confirm CBC and BMP (or CMP) results within 7 days for inpatient and 30 days for outpatient: Outpatients with severe anemia (hgb<10, CKD, severe thrombocytopenia plts<100) labs should be within 10 days. Only draw PT/INR on patients that are on Coumadin, Hgb<10, have liver disease (cirrhosis, liver CA, hepatitis, etc). Urine pregnancy test within hospital admission for inpatients of  child bearing age, for outpatients day of procedure.  . Confirm EKG performed within 30 days for cardiac procedures and 12 months for peripheral vascular procedures.  Place order for EKG if missing or not within timeframe.  . Verify aspirin and / or anti-platelet medication (Plavix, Effient, Brilinta) dose available for cardiac / peripheral vascular procedure day. IF ordered daily / once, adjust schedule to administer before procedure.  . Weigh patient  . Initiate Cath/PCI clinical path; encourage patient to watch CCTV video  . Clip R radial (no IV/bracelet R wrist)  . Clip R groin  . Insert peripheral IV  . Insert 2nd peripheral IV site-Saline lock IV    There are no outpatient Patient Instructions on file for this admission.   --Continue cardiac medications as reconciled in final medication list. --No follow-ups on file. Or sooner if needed. --Continue follow-up with your primary care physician regarding the management of your other chronic comorbid conditions.  Patient's questions and concerns were addressed to her satisfaction. She voices understanding of the instructions provided during this encounter.   This note was created using a voice recognition software as a result there may be grammatical errors inadvertently enclosed that do not reflect the nature of this encounter. Every attempt is made to correct such errors.  Total time spent: 35 minutes.  Tessa LernerSunit Tolia, [place]DO, [facility]CFACC  Pager: [phone] Office: [phone]

## 2020-01-25 ENCOUNTER — Encounter (HOSPITAL_COMMUNITY): Payer: Self-pay | Admitting: Cardiology

## 2020-01-25 ENCOUNTER — Encounter: Payer: Self-pay | Admitting: Cardiology

## 2020-01-25 ENCOUNTER — Telehealth: Payer: Self-pay

## 2020-01-25 NOTE — Telephone Encounter (Signed)
Thanks Manish, I agree she can go back to work.   Staff, please inform the patient I will forward her pre-op paperwork as well.

## 2020-01-25 NOTE — Telephone Encounter (Signed)
Pt called requesting return to work letter post her cath. Please advise

## 2020-01-25 NOTE — Telephone Encounter (Signed)
Pt aware.

## 2020-01-25 NOTE — Telephone Encounter (Signed)
Okay to return to work. She does registration at the hospital. No heavy lifting involved. I will put letter on her chart.  Thanks MJP

## 2020-01-26 ENCOUNTER — Other Ambulatory Visit: Payer: Self-pay

## 2020-01-26 ENCOUNTER — Ambulatory Visit
Admission: RE | Admit: 2020-01-26 | Discharge: 2020-01-26 | Disposition: A | Discharge status: Home / Self Care | Payer: BC Managed Care – PPO | Source: Ambulatory Visit | Attending: Family Medicine | Admitting: Family Medicine

## 2020-01-26 DIAGNOSIS — Z1231 Encounter for screening mammogram for malignant neoplasm of breast: Secondary | ICD-10-CM

## 2020-01-30 ENCOUNTER — Ambulatory Visit: Payer: BC Managed Care – PPO | Admitting: Cardiology

## 2020-01-30 ENCOUNTER — Other Ambulatory Visit: Payer: Self-pay

## 2020-01-30 ENCOUNTER — Encounter: Payer: Self-pay | Admitting: Cardiology

## 2020-01-30 VITALS — BP 138/86 | HR 75 | Ht 63.0 in | Wt 226.0 lb

## 2020-01-30 DIAGNOSIS — E119 Type 2 diabetes mellitus without complications: Secondary | ICD-10-CM | POA: Diagnosis not present

## 2020-01-30 DIAGNOSIS — Z01810 Encounter for preprocedural cardiovascular examination: Secondary | ICD-10-CM

## 2020-01-30 DIAGNOSIS — I10 Essential (primary) hypertension: Secondary | ICD-10-CM

## 2020-01-30 DIAGNOSIS — Z8673 Personal history of transient ischemic attack (TIA), and cerebral infarction without residual deficits: Secondary | ICD-10-CM

## 2020-01-30 DIAGNOSIS — E7800 Pure hypercholesterolemia, unspecified: Secondary | ICD-10-CM | POA: Diagnosis not present

## 2020-01-30 DIAGNOSIS — Z6841 Body Mass Index (BMI) 40.0 and over, adult: Secondary | ICD-10-CM

## 2020-01-30 NOTE — Progress Notes (Signed)
Date:  01/30/2020   ID:  [name], DOB Feb 24, 1961, MRN [id number]  PCP:  Renaye Rakers, MD  Cardiologist:  Tessa Lerner, DO, [facility] (established care 11/08/2019)  Date: 01/30/20 Last Office Visit: 12/22/2019  Chief Complaint  Patient presents with  . Follow-up    Post heart catheterization  . Pre-op Exam    HPI  [name] is a 59 y.o. female who presents to the office with a chief complaint of " post heart catheterization and preop risk stratification." Patient's past medical history and cardiovascular risk factors include: Hypertension, hyperlipidemia,  transient ischemic attack, non-insulin-dependent diabetes mellitus type 2, obesity due to excess calories.  Patient was referred to the office back in July 2021 for preoperative risk stratification at the request of her PCP.  Patient was planning to undergo a right shoulder scope with manipulation under general anesthesia with scalene block.  However, was referred to cardiology for preoperative stratification.  Since then patient has undergone an echocardiogram and stress test.  The stress test was noted to have reversible ischemia she was recommended to undergo left heart catheterization.    Patient did undergo left heart catheterization as per last office visit is noted to have no significant epicardial coronary artery disease.  Images were reviewed with her in great detail at today's visit as well.  Findings noted below.  Patient states that she is scheduled to have a right shoulder scope with Dr. Madelon Lips.  The date is to be determined.  Patient is currently euvolemic and not in congestive heart failure.  No active angina or anginal equivalent.  No significant valvular heart disease.  Patient has had surgery in the past without any complications with anesthesia, per patient.  Patient is a diabetic but not on insulin.  She has a history of TIA.  She is currently on aspirin secondary to her history of TIA.   FUNCTIONAL STATUS: No  structured exercise program or daily routine.    ALLERGIES: No Known Allergies  MEDICATION LIST PRIOR TO VISIT: Current Meds  Medication Sig  . amLODipine (NORVASC) 10 MG tablet Take 10 mg by mouth daily.  Marland Kitchen aspirin EC 325 MG tablet Take 325 mg by mouth daily. Swallow whole.   . diclofenac Sodium (VOLTAREN) 1 % GEL Apply 1 application topically daily as needed (pain).   Marland Kitchen ELDERBERRY PO Take 1 capsule by mouth daily.  . irbesartan-hydrochlorothiazide (AVALIDE) 300-12.5 MG tablet Take 1 tablet by mouth daily.  . nitroGLYCERIN (NITROSTAT) 0.4 MG SL tablet Place 1 tablet (0.4 mg total) under the tongue every 5 (five) minutes as needed for chest pain. If you require more than two tablets five minutes apart go to the nearest ER via EMS.  . NON FORMULARY [place]  . OZEMPIC, 1 MG/DOSE, 2 MG/1.5ML SOPN Inject 2 mg into the skin every Tuesday.   . Potassium Chloride ER 20 MEQ TBCR Take 20 mEq by mouth daily.   . rosuvastatin (CRESTOR) 20 MG tablet Take 20 mg by mouth daily.  Marland Kitchen SYNJARDY XR 25-1000 MG TB24 Take 1 tablet by mouth daily.  . traMADol (ULTRAM) 50 MG tablet Take 50 mg by mouth daily as needed for moderate pain.      PAST MEDICAL HISTORY: Past Medical History:  Diagnosis Date  . Diabetes mellitus without complication (HCC)   . Hyperlipidemia   . Hypertension   . Obesity   . TIA (transient ischemic attack)     PAST SURGICAL HISTORY: Past Surgical History:  Procedure Laterality Date  .  ABDOMINAL HYSTERECTOMY    . LEFT HEART CATH AND CORONARY ANGIOGRAPHY N/A 01/24/2020   Procedure: LEFT HEART CATH AND CORONARY ANGIOGRAPHY;  Surgeon: Elder Negus, MD;  Location: [facility] INVASIVE CV LAB;  Service: Cardiovascular;  Laterality: N/A;    FAMILY HISTORY: The patient family history includes Diabetes in her mother; Hypertension in her mother.  SOCIAL HISTORY:  The patient  reports that she has never smoked. She has never used smokeless tobacco. She reports that she does not drink  alcohol and does not use drugs.  REVIEW OF SYSTEMS: Review of Systems  Constitutional: Negative for chills and fever.  HENT: Negative for hoarse voice and nosebleeds.   Eyes: Negative for discharge, double vision and pain.  Cardiovascular: Negative for chest pain, claudication, dyspnea on exertion, leg swelling, near-syncope, orthopnea, palpitations, paroxysmal nocturnal dyspnea and syncope.  Respiratory: Negative for hemoptysis and shortness of breath.   Musculoskeletal: Negative for muscle cramps and myalgias.  Gastrointestinal: Negative for abdominal pain, constipation, diarrhea, hematemesis, hematochezia, melena, nausea and vomiting.  Neurological: Negative for dizziness and light-headedness.   PHYSICAL EXAM: Vitals with BMI 01/30/2020 01/24/2020 01/24/2020  Height 5\' 3"  - -  Weight 226 lbs - -  BMI 40.04 - -  Systolic 138 101 94  Diastolic 86 73 64  Pulse 75 74 73  Some encounter information is confidential and restricted. Go to Review Flowsheets activity to see all data.   CONSTITUTIONAL: Well-developed and well-nourished. No acute distress.  SKIN: Skin is warm and dry. No rash noted. No cyanosis. No pallor. No jaundice HEAD: Normocephalic and atraumatic.  EYES: No scleral icterus MOUTH/THROAT: Moist oral membranes.  NECK: No JVD present. No thyromegaly noted. No carotid bruits  LYMPHATIC: No visible cervical adenopathy.  CHEST Normal respiratory effort. No intercostal retractions  LUNGS: Clear to auscultation bilaterally.  No stridor. No wheezes. No rales.  CARDIOVASCULAR: Regular rate and rhythm, positive S1-S2, no murmurs rubs or gallops appreciated. ABDOMINAL: Obese, soft, nondistended, positive bowel sounds in all 4 quadrants.  No apparent ascites.  EXTREMITIES: No peripheral edema  HEMATOLOGIC: No significant bruising NEUROLOGIC: Oriented to person, place, and time. Nonfocal. Normal muscle tone.  PSYCHIATRIC: Normal mood and affect. Normal behavior.  Cooperative  CARDIAC DATABASE: EKG: 11/08/2019: Normal sinus rhythm, 75 bpm, poor R wave progression, T wave inversions in the inferior and lateral leads suggestive of ischemia, without underlying injury pattern.  No significant change compared to prior EKG dated 12/13/2015.  Echocardiogram: 11/18/2019:  Normal LV systolic function with visual EF 60-65%. Left ventricle cavity is normal in size. Normal global wall motion. Mild left ventricular hypertrophy. Normal diastolic filling pattern, normal LAP. Calculated EF 68%.  Mild tricuspid regurgitation. No evidence of pulmonary hypertension.  No other significant valvular abnormalities.  No prior study for comparison.  Stress Testing: Exercise Sestamibi Stress Test 12/12/2019: Normal ECG stress. The patient exercised for 4 minutes and 59 seconds of a Bruce protocol, achieving approximately 7.01 METs. Exercise was terminated due to fatigue/weakness.  Exercise capacity is reduced with accelerated HR response. Normal BP response.   Moderate degree medium extent fixed perfusion defect located in the  inferior wall of left ventricle most probably represents breast tissue attenuation. Minimal ischemic in this region cannot be excluded.  Gated SPECT imaging of the left ventricle was normal. All segments of left ventricle demonstrated normal wall motion and thickening. No stress lung uptake. TID is normal. Stress LV EF is normal 64%.  Low risk. Comparison 12/30/2010: Lexiscan stress: Normal perfusion.   Heart Catheterization:  01/24/2020 Dr. Evlyn ClinesManish Patwardhan: Right dominant circulation. No coronary artery disease. Normal LVEDP.  LABORATORY DATA: No recent labs for review.  IMPRESSION:  No diagnosis found.   RECOMMENDATIONS: Cynthia LarocheLydia R Billey is a 59 y.o. female whose past medical history and cardiac risk factors include: Hypertension, hyperlipidemia,  transient ischemic attack, non-insulin-dependent diabetes mellitus type 2, obesity due to excess  calories.  Preoperative risk stratification:   Patient presents low cardiac risk for the planned noncardiac surgery.  Using the preoperative risk assessment guidelines as published by the [employer], the patient presents low cardiac risk for the planned noncardiac surgery because there is no history of recent unstable angina pectoris or myocardial infarction, no signs or symptoms of decompensated CHF, no unaddressed complex dysrhythmia, no significant aortic valvular stenosis, and normal epicardial coronary arteries  on recent angiography.   This preoperative risk assessment is a tool to assist the surgeon in estimating the cardiac risk for the proposed upcoming noncardiac surgery.  The shared decision to proceed with surgery will be ultimately at the discretion of the patient after the surgical risks, benefits, and alternatives have been discussed amongst the patient and her surgical team.  Benign essential hypertension:  Patient's office blood pressures is well controlled and is asymptomatic.  Patient states that her blood pressures are better controlled at home.  Medications reconciled.  Recommended low-salt diet.    Currently managed by primary care provider.  Non-insulin-dependent diabetes mellitus: Educated on the importance of glycemic control.  Currently managed by primary care provider.  Obesity, due to excess calories: Body mass index is 40.03 kg/m. . I reviewed with the patient the importance of diet, regular physical activity/exercise, weight loss.   . Continue with the current activity level.  Until further cardiac evaluation is not performed would not recommend uptitrating physical activity.  FINAL MEDICATION LIST END OF ENCOUNTER: No orders of the defined types were placed in this encounter.    Current Outpatient Medications:  .  amLODipine (NORVASC) 10 MG tablet, Take 10 mg by mouth daily., Disp: , Rfl:  .  aspirin EC 325 MG tablet, Take 325 mg by  mouth daily. Swallow whole. , Disp: , Rfl:  .  diclofenac Sodium (VOLTAREN) 1 % GEL, Apply 1 application topically daily as needed (pain). , Disp: , Rfl:  .  ELDERBERRY PO, Take 1 capsule by mouth daily., Disp: , Rfl:  .  irbesartan-hydrochlorothiazide (AVALIDE) 300-12.5 MG tablet, Take 1 tablet by mouth daily., Disp: , Rfl:  .  nitroGLYCERIN (NITROSTAT) 0.4 MG SL tablet, Place 1 tablet (0.4 mg total) under the tongue every 5 (five) minutes as needed for chest pain. If you require more than two tablets five minutes apart go to the nearest ER via EMS., Disp: 30 tablet, Rfl: 0 .  NON FORMULARY, [place], Disp: , Rfl:  .  OZEMPIC, 1 MG/DOSE, 2 MG/1.5ML SOPN, Inject 2 mg into the skin every Tuesday. , Disp: , Rfl:  .  Potassium Chloride ER 20 MEQ TBCR, Take 20 mEq by mouth daily. , Disp: , Rfl:  .  rosuvastatin (CRESTOR) 20 MG tablet, Take 20 mg by mouth daily., Disp: , Rfl:  .  SYNJARDY XR 25-1000 MG TB24, Take 1 tablet by mouth daily., Disp: , Rfl:  .  traMADol (ULTRAM) 50 MG tablet, Take 50 mg by mouth daily as needed for moderate pain. , Disp: , Rfl:   No orders of the defined types were placed in this encounter.   There are no Patient  Instructions on file for this visit.   --Continue cardiac medications as reconciled in final medication list. --Return in about 1 year (around 01/29/2021) for 1 year follow up or as needed. . Or sooner if needed. --Continue follow-up with your primary care physician regarding the management of your other chronic comorbid conditions.  Patient's questions and concerns were addressed to her satisfaction. She voices understanding of the instructions provided during this encounter.   This note was created using a voice recognition software as a result there may be grammatical errors inadvertently enclosed that do not reflect the nature of this encounter. Every attempt is made to correct such errors.  Total time spent: 31 minutes.  Tessa Lerner, [place], [facility]  Pager:  [phone] Office: [phone]

## 2020-02-08 ENCOUNTER — Ambulatory Visit: Payer: BC Managed Care – PPO | Admitting: Cardiology

## 2020-04-05 DIAGNOSIS — E785 Hyperlipidemia, unspecified: Secondary | ICD-10-CM | POA: Diagnosis not present

## 2020-04-05 DIAGNOSIS — M1310 Monoarthritis, not elsewhere classified, unspecified site: Secondary | ICD-10-CM | POA: Diagnosis not present

## 2020-04-05 DIAGNOSIS — E1169 Type 2 diabetes mellitus with other specified complication: Secondary | ICD-10-CM | POA: Diagnosis not present

## 2020-04-05 DIAGNOSIS — I10 Essential (primary) hypertension: Secondary | ICD-10-CM | POA: Diagnosis not present

## 2020-04-12 ENCOUNTER — Ambulatory Visit (HOSPITAL_COMMUNITY)
Admission: RE | Admit: 2020-04-12 | Discharge: 2020-04-12 | Disposition: A | Discharge status: Home / Self Care | Payer: BC Managed Care – PPO | Source: Ambulatory Visit | Attending: Pulmonary Disease | Admitting: Pulmonary Disease

## 2020-04-12 ENCOUNTER — Telehealth: Payer: Self-pay | Admitting: Nurse Practitioner

## 2020-04-12 ENCOUNTER — Other Ambulatory Visit: Payer: Self-pay | Admitting: Family

## 2020-04-12 DIAGNOSIS — U071 COVID-19: Secondary | ICD-10-CM

## 2020-04-12 MED ORDER — SODIUM CHLORIDE 0.9 % IV SOLN: Freq: Once | INTRAVENOUS | Status: AC

## 2020-04-12 MED ORDER — FAMOTIDINE IN NACL 20-0.9 MG/50ML-% IV SOLN: 20.0000 mg | Freq: Once | INTRAVENOUS | Status: DC | PRN

## 2020-04-12 MED ORDER — ALBUTEROL SULFATE HFA 108 (90 BASE) MCG/ACT IN AERS
2.0000 | INHALATION_SPRAY | Freq: Once | RESPIRATORY_TRACT | Status: DC | PRN
Start: 2020-04-12 — End: 2020-04-13

## 2020-04-12 MED ORDER — SODIUM CHLORIDE 0.9 % IV SOLN: INTRAVENOUS | Status: DC | PRN

## 2020-04-12 MED ORDER — DIPHENHYDRAMINE HCL 50 MG/ML IJ SOLN: 50.0000 mg | Freq: Once | INTRAMUSCULAR | Status: DC | PRN

## 2020-04-12 MED ORDER — EPINEPHRINE 0.3 MG/0.3ML IJ SOAJ: 0.3000 mg | Freq: Once | INTRAMUSCULAR | Status: DC | PRN

## 2020-04-12 MED ORDER — ACETAMINOPHEN 325 MG PO TABS
650.0000 mg | ORAL_TABLET | Freq: Four times a day (QID) | ORAL | Status: DC | PRN
  Administered 2020-04-12: 650 mg via ORAL

## 2020-04-12 MED ORDER — METHYLPREDNISOLONE SODIUM SUCC 125 MG IJ SOLR: 125.0000 mg | Freq: Once | INTRAMUSCULAR | Status: DC | PRN

## 2020-04-12 NOTE — Progress Notes (Signed)
Patient reviewed Fact Sheet for Patients, Parents, and Caregivers for Emergency Use Authorization (EUA) of Casi/Regen for the Treatment of Coronavirus. Patient also reviewed and is agreeable to the estimated cost of treatment. Patient is agreeable to proceed.    

## 2020-04-12 NOTE — Progress Notes (Signed)
  Diagnosis: COVID-19  Physician: Dr. Wright   Procedure: Covid Infusion Clinic Med: casirivimab\imdevimab infusion - Provided patient with casirivimab\imdevimab fact sheet for patients, parents and caregivers prior to infusion.  Complications: No immediate complications noted.  Discharge: Discharged home   [name]  [name] 04/12/2020   

## 2020-04-12 NOTE — Telephone Encounter (Signed)
Called to Discuss with patient about Covid symptoms and the use of the monoclonal antibody infusion for those with mild to moderate Covid symptoms and at a high risk of hospitalization.     Pt appears to qualify for this infusion due to co-morbid conditions and/or a member of an at-risk group in accordance with the FDA Emergency Use Authorization. However, due to limited supply currently unvaccinated patients at high risk are being prioritized for treatment. Patient will be added to cancellation list and called to come in for treatment if there is adequate supply.   They are feeling much better today. Symptom tier reviewed with instructions to go to emergency department if symptoms worsen.     Symptom onset: 12/26 Vaccinated: yes Qualified for Infusion: BMI, HTN, DM, SVI

## 2020-04-12 NOTE — Discharge Instructions (Signed)
10 Things You Can Do to Manage Your COVID-19 Symptoms at Home If you have possible or confirmed COVID-19: 1. Stay home from work and school. And stay away from other public places. If you must go out, avoid using any kind of public transportation, ridesharing, or taxis. 2. Monitor your symptoms carefully. If your symptoms get worse, call your healthcare provider immediately. 3. Get rest and stay hydrated. 4. If you have a medical appointment, call the healthcare provider ahead of time and tell them that you have or may have COVID-19. 5. For medical emergencies, call 911 and notify the dispatch personnel that you have or may have COVID-19. 6. Cover your cough and sneezes with a tissue or use the inside of your elbow. 7. Wash your hands often with soap and water for at least 20 seconds or clean your hands with an alcohol-based hand sanitizer that contains at least 60% alcohol. 8. As much as possible, stay in a specific room and away from other people in your home. Also, you should use a separate bathroom, if available. If you need to be around other people in or outside of the home, wear a mask. 9. Avoid sharing personal items with other people in your household, like dishes, towels, and bedding. 10. Clean all surfaces that are touched often, like counters, tabletops, and doorknobs. Use household cleaning sprays or wipes according to the label instructions. cdc.gov/coronavirus 10/13/2018 This information is not intended to replace advice given to you by your health care provider. Make sure you discuss any questions you have with your health care provider. Document Revised: 03/17/2019 Document Reviewed: 03/17/2019 Elsevier Patient Education  2020 Elsevier Inc. What types of side effects do monoclonal antibody drugs cause?  Common side effects  In general, the more common side effects caused by monoclonal antibody drugs include: . Allergic reactions, such as hives or itching . Flu-like signs and  symptoms, including chills, fatigue, fever, and muscle aches and pains . Nausea, vomiting . Diarrhea . Skin rashes . Low blood pressure   The CDC is recommending patients who receive monoclonal antibody treatments wait at least 90 days before being vaccinated.  Currently, there are no data on the safety and efficacy of mRNA COVID-19 vaccines in persons who received monoclonal antibodies or convalescent plasma as part of COVID-19 treatment. Based on the estimated half-life of such therapies as well as evidence suggesting that reinfection is uncommon in the 90 days after initial infection, vaccination should be deferred for at least 90 days, as a precautionary measure until additional information becomes available, to avoid interference of the antibody treatment with vaccine-induced immune responses. If you have any questions or concerns after the infusion please call the Advanced Practice Provider on call at [phone]. This number is ONLY intended for your use regarding questions or concerns about the infusion post-treatment side-effects.  Please do not provide this number to others for use. For return to work notes please contact your primary care provider.   If someone you know is interested in receiving treatment please have them call the COVID hotline at [phone].   

## 2020-04-20 ENCOUNTER — Other Ambulatory Visit: Payer: BC Managed Care – PPO

## 2020-04-20 DIAGNOSIS — Z20822 Contact with and (suspected) exposure to covid-19: Secondary | ICD-10-CM

## 2020-04-23 LAB — NOVEL CORONAVIRUS, NAA: SARS-CoV-2, NAA: NOT DETECTED

## 2020-07-20 DIAGNOSIS — E1169 Type 2 diabetes mellitus with other specified complication: Secondary | ICD-10-CM | POA: Diagnosis not present

## 2020-07-20 DIAGNOSIS — I10 Essential (primary) hypertension: Secondary | ICD-10-CM | POA: Diagnosis not present

## 2020-07-20 DIAGNOSIS — E785 Hyperlipidemia, unspecified: Secondary | ICD-10-CM | POA: Diagnosis not present

## 2020-07-20 DIAGNOSIS — E669 Obesity, unspecified: Secondary | ICD-10-CM | POA: Diagnosis not present

## 2020-07-20 LAB — HEPATIC FUNCTION PANEL
ALT: 15 (ref 7–35)
AST: 18 (ref 13–35)
Alkaline Phosphatase: 82 (ref 25–125)
Bilirubin, Total: 1.3

## 2020-07-20 LAB — COMPREHENSIVE METABOLIC PANEL
Albumin: 4.9 (ref 3.5–5.0)
Calcium: 10.1 (ref 8.7–10.7)
GFR calc Af Amer: 110
GFR calc non Af Amer: 95
Globulin: 2.4

## 2020-07-20 LAB — BASIC METABOLIC PANEL
BUN: 17 (ref 4–21)
CO2: 29 — AB (ref 13–22)
Chloride: 101 (ref 99–108)
Creatinine: 0.7 (ref 0.5–1.1)
Glucose: 123
Potassium: 4.3 (ref 3.4–5.3)
Sodium: 141 (ref 137–147)

## 2020-07-20 LAB — CBC AND DIFFERENTIAL
HCT: 39 (ref 36–46)
Hemoglobin: 12.8 (ref 12.0–16.0)
Platelets: 284 (ref 150–399)
WBC: 4.9

## 2020-07-20 LAB — HEMOGLOBIN A1C: Hemoglobin A1C: 8

## 2020-07-20 LAB — LIPID PANEL
Cholesterol: 195 (ref 0–200)
HDL: 55 (ref 35–70)
LDL Cholesterol: 117
Triglycerides: 115 (ref 40–160)

## 2020-07-27 ENCOUNTER — Ambulatory Visit: Payer: BC Managed Care – PPO | Attending: Internal Medicine

## 2020-07-27 ENCOUNTER — Other Ambulatory Visit: Payer: Self-pay

## 2020-07-27 DIAGNOSIS — Z23 Encounter for immunization: Secondary | ICD-10-CM

## 2020-07-27 NOTE — Progress Notes (Signed)
   Covid-19 Vaccination Clinic  Name:  CHASYA ZENZ    MRN: [id number] DOB: 04/30/1960  07/27/2020  Ms. Disbro was observed post Covid-19 immunization for 15 minutes without incident. She was provided with Vaccine Information Sheet and instruction to access the V-Safe system.   Ms. Schamberger was instructed to call 911 with any severe reactions post vaccine: Marland Kitchen Difficulty breathing  . Swelling of face and throat  . A fast heartbeat  . A bad rash all over body  . Dizziness and weakness   Immunizations Administered    Name Date Dose VIS Date Route   PFIZER Comrnaty(Gray TOP) Covid-19 Vaccine 07/27/2020  9:56 AM 0.3 mL 03/22/2020 Intramuscular   Manufacturer: [employer], [employer]   Lot: [id number]   NDC: [phone]

## 2020-07-30 ENCOUNTER — Other Ambulatory Visit (HOSPITAL_BASED_OUTPATIENT_CLINIC_OR_DEPARTMENT_OTHER): Payer: Self-pay

## 2020-07-30 MED ORDER — COVID-19 MRNA VACCINE (PFIZER) 30 MCG/0.3ML IM SUSP
INTRAMUSCULAR | 0 refills | Status: DC
  Filled 2020-07-30: qty 0.3, 1d supply, fill #0

## 2020-11-01 ENCOUNTER — Other Ambulatory Visit: Payer: Self-pay

## 2020-11-01 ENCOUNTER — Encounter (INDEPENDENT_AMBULATORY_CARE_PROVIDER_SITE_OTHER): Payer: Self-pay | Admitting: Family Medicine

## 2020-11-01 ENCOUNTER — Ambulatory Visit (INDEPENDENT_AMBULATORY_CARE_PROVIDER_SITE_OTHER): Payer: BC Managed Care – PPO | Admitting: Family Medicine

## 2020-11-01 VITALS — BP 114/74 | HR 70 | Temp 98.3°F | Ht 63.0 in | Wt 224.0 lb

## 2020-11-01 DIAGNOSIS — E1169 Type 2 diabetes mellitus with other specified complication: Secondary | ICD-10-CM

## 2020-11-01 DIAGNOSIS — E1159 Type 2 diabetes mellitus with other circulatory complications: Secondary | ICD-10-CM

## 2020-11-01 DIAGNOSIS — Z6839 Body mass index (BMI) 39.0-39.9, adult: Secondary | ICD-10-CM

## 2020-11-01 DIAGNOSIS — E782 Mixed hyperlipidemia: Secondary | ICD-10-CM

## 2020-11-01 DIAGNOSIS — R0602 Shortness of breath: Secondary | ICD-10-CM

## 2020-11-01 DIAGNOSIS — I152 Hypertension secondary to endocrine disorders: Secondary | ICD-10-CM | POA: Insufficient documentation

## 2020-11-01 DIAGNOSIS — E559 Vitamin D deficiency, unspecified: Secondary | ICD-10-CM | POA: Diagnosis not present

## 2020-11-01 DIAGNOSIS — Z1331 Encounter for screening for depression: Secondary | ICD-10-CM | POA: Diagnosis not present

## 2020-11-01 DIAGNOSIS — Z9189 Other specified personal risk factors, not elsewhere classified: Secondary | ICD-10-CM

## 2020-11-01 DIAGNOSIS — E119 Type 2 diabetes mellitus without complications: Secondary | ICD-10-CM | POA: Insufficient documentation

## 2020-11-01 DIAGNOSIS — R5383 Other fatigue: Secondary | ICD-10-CM | POA: Diagnosis not present

## 2020-11-01 DIAGNOSIS — Z0289 Encounter for other administrative examinations: Secondary | ICD-10-CM

## 2020-11-05 DIAGNOSIS — E559 Vitamin D deficiency, unspecified: Secondary | ICD-10-CM | POA: Diagnosis not present

## 2020-11-05 DIAGNOSIS — E1169 Type 2 diabetes mellitus with other specified complication: Secondary | ICD-10-CM | POA: Diagnosis not present

## 2020-11-05 DIAGNOSIS — R5383 Other fatigue: Secondary | ICD-10-CM | POA: Diagnosis not present

## 2020-11-06 LAB — CBC WITH DIFFERENTIAL/PLATELET
Basophils Absolute: 0 10*3/uL (ref 0.0–0.2)
Basos: 1 %
EOS (ABSOLUTE): 0.1 10*3/uL (ref 0.0–0.4)
Eos: 3 %
Hematocrit: 36.9 % (ref 34.0–46.6)
Hemoglobin: 12.4 g/dL (ref 11.1–15.9)
Immature Grans (Abs): 0 10*3/uL (ref 0.0–0.1)
Immature Granulocytes: 0 %
Lymphocytes Absolute: 2.3 10*3/uL (ref 0.7–3.1)
Lymphs: 41 %
MCH: 28.1 pg (ref 26.6–33.0)
MCHC: 33.6 g/dL (ref 31.5–35.7)
MCV: 84 fL (ref 79–97)
Monocytes Absolute: 0.4 10*3/uL (ref 0.1–0.9)
Monocytes: 7 %
Neutrophils Absolute: 2.8 10*3/uL (ref 1.4–7.0)
Neutrophils: 48 %
Platelets: 323 10*3/uL (ref 150–450)
RBC: 4.41 x10E6/uL (ref 3.77–5.28)
RDW: 13.1 % (ref 11.7–15.4)
WBC: 5.7 10*3/uL (ref 3.4–10.8)

## 2020-11-06 LAB — T4, FREE: Free T4: 1.15 ng/dL (ref 0.82–1.77)

## 2020-11-06 LAB — INSULIN, RANDOM: INSULIN: 26.3 u[IU]/mL — ABNORMAL HIGH (ref 2.6–24.9)

## 2020-11-06 LAB — FOLATE: Folate: 7.7 ng/mL (ref >3.0)

## 2020-11-06 LAB — HEMOGLOBIN A1C
Est. average glucose Bld gHb Est-mCnc: 232 mg/dL
Hgb A1c MFr Bld: 9.7 % — ABNORMAL HIGH (ref 4.8–5.6)

## 2020-11-06 LAB — VITAMIN D 25 HYDROXY (VIT D DEFICIENCY, FRACTURES): Vit D, 25-Hydroxy: 8.3 ng/mL — ABNORMAL LOW (ref 30.0–100.0)

## 2020-11-06 LAB — TSH: TSH: 1.43 u[IU]/mL (ref 0.450–4.500)

## 2020-11-06 LAB — VITAMIN B12: Vitamin B-12: 319 pg/mL (ref 232–1245)

## 2020-11-06 LAB — T3: T3, Total: 128 ng/dL (ref 71–180)

## 2020-11-12 NOTE — Progress Notes (Signed)
Chief Complaint:   OBESITY ALGIE CALES (MR# [id number]) is a 60 y.o. female who presents for evaluation and treatment of obesity and related comorbidities. Current BMI is Body mass index is 39.68 kg/m. Ellaree has been struggling with her weight for many years and has been unsuccessful in either losing weight, maintaining weight loss, or reaching her healthy weight goal.  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 She lives alone, and she notes Weight Watcher's in the past has worked best, to be accountable.  Tameca is currently in the action stage of change and ready to dedicate time achieving and maintaining a healthier weight. Chiara is interested in becoming our patient and working on intensive lifestyle modifications including (but not limited to) diet and exercise for weight loss.  Sebastian's habits were reviewed today and are as follows: Her family eats meals together, her desired weight loss is 59 lbs, she has been heavy most of her life, she started gaining weight in college, her heaviest weight ever was 256 pounds, she has significant food cravings issues, she snacks frequently in the evenings, she skips meals frequently, she is frequently drinking liquids with calories, she frequently makes poor food choices, she frequently eats larger portions than normal, and she struggles with emotional eating.  Depression Screen Brynnly's Food and Mood (modified PHQ-9) score was 14.  Depression screen PHQ 2/9 11/01/2020  Decreased Interest 1  Down, Depressed, Hopeless 1  PHQ - 2 Score 2  Altered sleeping 2  Tired, decreased energy 3  Change in appetite 1  Feeling bad or failure about yourself  3  Trouble concentrating 2  Moving slowly or fidgety/restless 1  Suicidal thoughts 0  PHQ-9 Score 14  Difficult doing work/chores Not difficult at all   Subjective:   1. Other fatigue Cady admits to daytime somnolence and admits to waking up still tired. Patent has a history  of symptoms of daytime fatigue. Shaliyah generally gets 5 hours of sleep per night, and states that she has difficulty falling asleep. Snoring is not present. Apneic episodes are not present. Epworth Sleepiness Score is 6.  2. Shortness of breath on exertion Lavergne notes increasing shortness of breath with exercising and seems to be worsening over time with weight gain. She notes getting out of breath sooner with activity than she used to. This has not gotten worse recently. Kahmari denies shortness of breath at rest or orthopnea.  3. Type 2 diabetes mellitus with other specified complication, without long-term current use of insulin (HCC) Aamani is on [place]. Medications reviewed. Diabetic ROS: no polyuria or polydipsia, no chest pain, dyspnea or TIA's, no numbness, tingling or pain in extremities.   4. Hypertension associated with diabetes (HCC) Niaomi has hypertension associated with diabetes mellitus. Cardiovascular ROS: no chest pain or dyspnea on exertion.  5. Mixed diabetic hyperlipidemia associated with type 2 diabetes mellitus (HCC) Kamali had a 8 cath on 07/2019 and it was within normal limits. She has been trying to improve her cholesterol levels with intensive lifestyle modification including a low saturated fat diet, exercise and weight loss. She denies any chest pain, claudication or myalgias.  6. Vitamin D deficiency Khrystyne is not currently taking Vit D. She denies nausea, vomiting or muscle weakness.  7. At risk for impaired metabolic function Bethenny is at increased risk for impaired metabolic function due to current nutrition and muscle mass.  Assessment/Plan:   1. Other fatigue Mihika does feel that her weight is  causing her energy to be lower than it should be. Fatigue may be related to obesity, depression or many other causes. Labs will be ordered, and in the meanwhile, Sutton will focus on self care including making healthy food choices, increasing physical activity and  focusing on stress reduction.  - EKG 12-Lead - CBC with Differential/Platelet - Vitamin B12 - Folate - T3 - T4, free - TSH  2. Shortness of breath on exertion Oza does feel that she gets out of breath more easily that she used to when she exercises. Qianna's shortness of breath appears to be obesity related and exercise induced. She has agreed to work on weight loss and gradually increase exercise to treat her exercise induced shortness of breath. Will continue to monitor closely.  3. Type 2 diabetes mellitus with other specified complication, without long-term current use of insulin (HCC) Good blood sugar control is important to decrease the likelihood of diabetic complications such as nephropathy, neuropathy, limb loss, blindness, coronary artery disease, and death. Intensive lifestyle modification including diet, exercise and weight loss are the first line of treatment for diabetes.   - Insulin, random - Hemoglobin A1c  4. Hypertension associated with diabetes (HCC) Crystel is working on healthy weight loss and exercise to improve blood pressure control. We will watch for signs of hypotension as she continues her lifestyle modifications.  5. Mixed diabetic hyperlipidemia associated with type 2 diabetes mellitus (HCC) Cardiovascular risk and specific lipid/LDL goals reviewed.  We discussed several lifestyle modifications today and Kaidyn will continue to work on diet, exercise and weight loss efforts. Orders and follow up as documented in patient record.   Counseling Intensive lifestyle modifications are the first line treatment for this issue. Dietary changes: Increase soluble fiber. Decrease simple carbohydrates. Exercise changes: Moderate to vigorous-intensity aerobic activity 150 minutes per week if tolerated. Lipid-lowering medications: see documented in medical record.  6. Vitamin D deficiency Low Vitamin D level contributes to fatigue and are associated with obesity, breast,  and colon cancer. She agrees to continue to take prescription Vitamin D @50 ,000 IU every week and will follow-up for routine testing of Vitamin D, at least 2-3 times per year to avoid over-replacement.  - VITAMIN D 25 Hydroxy (Vit-D Deficiency, Fractures)  7. Depression screening Nykerria had a positive depression screening. Depression is commonly associated with obesity and often results in emotional eating behaviors. We will monitor this closely and work on CBT to help improve the non-hunger eating patterns. Referral to Psychology may be required if no improvement is seen as she continues in our clinic.  8. At risk for impaired metabolic function Ilisa was given approximately 15 minutes of impaired  metabolic function prevention counseling today. We discussed intensive lifestyle modifications today with an emphasis on specific nutrition and exercise instructions and strategies.   Repetitive spaced learning was employed today to elicit superior memory formation and behavioral change.  9. Class 2 severe obesity with serious comorbidity and body mass index (BMI) of 39.0 to 39.9 in adult, unspecified obesity type (HCC) Azayla is currently in the action stage of change and her goal is to continue with weight loss efforts. I recommend Cerys begin the structured treatment plan as follows:  She has agreed to the Category 1 Plan.  Debbora had labs done at her primary care physician's office on 07/20/2020.  Exercise goals: As is.   Behavioral modification strategies: increasing lean protein intake, decreasing simple carbohydrates, better snacking choices, and planning for success.  She was informed of the  importance of frequent follow-up visits to maximize her success with intensive lifestyle modifications for her multiple health conditions. She was informed we would discuss her lab results at her next visit unless there is a critical issue that needs to be addressed sooner. Danielly agreed to keep her next  visit at the agreed upon time to discuss these results.  Objective:   Blood pressure 114/74, pulse 70, temperature 98.3 F (36.8 C), height 5\' 3"  (1.6 m), weight 224 lb (101.6 kg), SpO2 97 %. Body mass index is 39.68 kg/m.  EKG: Normal sinus rhythm, rate 69 BPM.  Indirect Calorimeter completed today shows a VO2 of 219 and a REE of 1512.  Her calculated basal metabolic rate is thus her basal metabolic rate is worse than expected.  General: Cooperative, alert, well developed, in no acute distress. HEENT: Conjunctivae and lids unremarkable. Cardiovascular: Regular rhythm.  Lungs: Normal work of breathing. Neurologic: No focal deficits.   Lab Results  Component Value Date   CREATININE 0.97 01/24/2020   BUN 18 01/24/2020   NA 138 01/24/2020   K 2.7 (LL) 01/24/2020   CL 100 01/24/2020   CO2 25 01/24/2020   Lab Results  Component Value Date   ALT 23 12/28/2010   AST 22 12/28/2010   ALKPHOS 79 12/28/2010   BILITOT 0.6 12/28/2010   Lab Results  Component Value Date   HGBA1C 9.7 (H) 11/05/2020   HGBA1C 11.3 (H) 12/29/2010   Lab Results  Component Value Date   INSULIN 26.3 (H) 11/05/2020   Lab Results  Component Value Date   TSH 1.430 11/05/2020   Lab Results  Component Value Date   CHOL 198 12/29/2010   HDL 41 12/29/2010   LDLCALC 120 (H) 12/29/2010   TRIG 187 (H) 12/29/2010   CHOLHDL 4.8 12/29/2010   Lab Results  Component Value Date   WBC 5.7 11/05/2020   HGB 12.4 11/05/2020   HCT 36.9 11/05/2020   MCV 84 11/05/2020   PLT 323 11/05/2020   No results found for: IRON, TIBC, FERRITIN  Attestation Statements:   Reviewed by clinician on day of visit: allergies, medications, problem list, medical history, surgical history, family history, social history, and previous encounter notes.   11/07/2020, am acting as transcriptionist for Trude Mcburney, DO.  I have reviewed the above documentation for accuracy and completeness, and I agree with the  above. [employer], D.O.  The 21st Century Cures Act was signed into law in 2016 which includes the topic of electronic health records.  This provides immediate access to information in MyChart.  This includes consultation notes, operative notes, office notes, lab results and pathology reports.  If you have any questions about what you read please let 2017 know at your next visit so we can discuss your concerns and take corrective action if need be.  We are right here with you.

## 2020-11-15 ENCOUNTER — Ambulatory Visit (INDEPENDENT_AMBULATORY_CARE_PROVIDER_SITE_OTHER): Payer: BC Managed Care – PPO | Admitting: Family Medicine

## 2020-11-15 ENCOUNTER — Other Ambulatory Visit: Payer: Self-pay

## 2020-11-15 ENCOUNTER — Encounter (INDEPENDENT_AMBULATORY_CARE_PROVIDER_SITE_OTHER): Payer: Self-pay | Admitting: Family Medicine

## 2020-11-15 VITALS — BP 82/54 | HR 78 | Temp 98.1°F | Ht 63.0 in | Wt 214.0 lb

## 2020-11-15 DIAGNOSIS — Z9189 Other specified personal risk factors, not elsewhere classified: Secondary | ICD-10-CM

## 2020-11-15 DIAGNOSIS — Z6839 Body mass index (BMI) 39.0-39.9, adult: Secondary | ICD-10-CM

## 2020-11-15 DIAGNOSIS — E559 Vitamin D deficiency, unspecified: Secondary | ICD-10-CM

## 2020-11-15 DIAGNOSIS — E1169 Type 2 diabetes mellitus with other specified complication: Secondary | ICD-10-CM | POA: Diagnosis not present

## 2020-11-15 DIAGNOSIS — E1159 Type 2 diabetes mellitus with other circulatory complications: Secondary | ICD-10-CM | POA: Diagnosis not present

## 2020-11-15 DIAGNOSIS — I152 Hypertension secondary to endocrine disorders: Secondary | ICD-10-CM

## 2020-11-15 MED ORDER — VITAMIN D (ERGOCALCIFEROL) 1.25 MG (50000 UNIT) PO CAPS
50000.0000 [IU] | ORAL_CAPSULE | ORAL | 0 refills | Status: DC
Start: 2020-11-15 — End: 2022-03-31

## 2020-11-15 NOTE — Patient Instructions (Signed)
Health Maintenance Due  Topic Date Due   PNEUMOCOCCAL POLYSACCHARIDE VACCINE AGE 60-64 HIGH RISK  Never done   FOOT EXAM  Never done   OPHTHALMOLOGY EXAM  Never done   HIV Screening  Never done   Hepatitis C Screening  Never done   TETANUS/TDAP  Never done   PAP SMEAR-Modifier  Never done   COLONOSCOPY (Pts 45-57yrs Insurance coverage will need to be confirmed)  Never done   Zoster Vaccines- Shingrix (1 of 2) Never done   COVID-19 Vaccine (2 - Pfizer series) 08/17/2020   INFLUENZA VACCINE  11/12/2020    Depression screen PHQ 2/9 11/01/2020  Decreased Interest 1  Down, Depressed, Hopeless 1  PHQ - 2 Score 2  Altered sleeping 2  Tired, decreased energy 3  Change in appetite 1  Feeling bad or failure about yourself  3  Trouble concentrating 2  Moving slowly or fidgety/restless 1  Suicidal thoughts 0  PHQ-9 Score 14  Difficult doing work/chores Not difficult at all

## 2020-11-19 DIAGNOSIS — E1169 Type 2 diabetes mellitus with other specified complication: Secondary | ICD-10-CM | POA: Diagnosis not present

## 2020-11-19 DIAGNOSIS — E6609 Other obesity due to excess calories: Secondary | ICD-10-CM | POA: Diagnosis not present

## 2020-11-19 DIAGNOSIS — E782 Mixed hyperlipidemia: Secondary | ICD-10-CM | POA: Diagnosis not present

## 2020-11-19 DIAGNOSIS — E785 Hyperlipidemia, unspecified: Secondary | ICD-10-CM | POA: Diagnosis not present

## 2020-11-19 NOTE — Progress Notes (Signed)
Chief Complaint:   OBESITY [name] is here to discuss her progress with her obesity treatment plan along with follow-up of her obesity related diagnoses. [name] is on the Category 1 Plan and states she is following her eating plan approximately 80% of the time. [name] states she is not currently exercising.  Today's visit was #: 2 Starting weight: 224 lbs Starting date: 11/01/2020 Today's weight: 214 lbs Today's date: 11/15/2020 Total lbs lost to date: 10 Total lbs lost since last in-office visit: 10  Interim History: [name] is here today for her first follow-up office visit since starting the program with [place].  All blood work/ lab tests that were recently ordered by myself or an outside provider were reviewed with patient today per their request.   Extended time was spent counseling her on all new disease processes that were discovered or preexisting ones that are affected by BMI.  she understands that many of these abnormalities will need to monitored regularly along with the current treatment plan of prudent dietary changes, in which we are making each and every office visit, to improve these health parameters.  [name] was not hungry at all while eating on plan and didn't eat out like usual. For 5-6 days she ate exactly what was on plan. She denies cravings. Snack calories were used on- 25 calorie fruit pop, skinny pop, apples, Kind bar. On some days, she ate 10 oz protein at night.  Subjective:   1. Type 2 diabetes mellitus with other specified complication, without long-term current use of insulin (HCC) Worsening. Discussed labs with patient today. [name]'s fasting blood sugars are in the 170's with no lows. She was diagnosed with diabetes mellitus 6-7 years ago. Her last A1c was 4-5 months ago at 8.4 and usually runs 8-11.0.   Lab Results  Component Value Date   HGBA1C 9.7 (H) 11/05/2020   HGBA1C 8.0 07/20/2020   HGBA1C 11.3 (H) 12/29/2010   Lab Results  Component Value Date    LDLCALC 117 07/20/2020   CREATININE 0.7 07/20/2020   Lab Results  Component Value Date   INSULIN 26.3 (H) 11/05/2020   2. Hypertension associated with diabetes (HCC) Discussed labs with patient today. [name] is not checking BP at home. She reports being "a little dizzy" for a couple of days, lasting a few seconds, but went away when she ate something. She has no other symptoms.   BP Readings from Last 3 Encounters:  11/15/20 (!) 82/54  11/01/20 114/74  04/12/20 106/74   Lab Results  Component Value Date   CREATININE 0.7 07/20/2020   CREATININE 0.97 01/24/2020   CREATININE 0.48 (L) 12/31/2010   3. Vitamin D deficiency New. Discussed labs with patient today. She is currently taking no vitamin D supplement. She denies nausea, vomiting or muscle weakness.  Lab Results  Component Value Date   VD25OH 8.3 (L) 11/05/2020   Assessment/Plan:   Meds ordered this encounter  Medications   Vitamin D, Ergocalciferol, (DRISDOL) 1.25 MG (50000 UNIT) CAPS capsule    Sig: Take 1 capsule (50,000 Units total) by mouth every 7 (seven) days.    Dispense:  4 capsule    Refill:  0    1 mo supply, OV for RF     1. Type 2 diabetes mellitus with other specified complication, without long-term current use of insulin (HCC) Not at goal. Good blood sugar control is important to decrease the likelihood of diabetic complications such as nephropathy, neuropathy, limb loss, blindness,  coronary artery disease, and death. Intensive lifestyle modification including prudent nutritional plan, exercise and weight loss are the first line of treatment for diabetes. Medication management per PCP.   2. Hypertension associated with diabetes (HCC) Low normal. Pt is Asx.  [name] will check BP and HR at home and bring log to next OV. She is working on healthy weight loss and exercise to improve blood pressure control. We will watch for signs of hypotension as she continues her lifestyle modifications.  3. Vitamin D  deficiency Low Vitamin D level contributes to fatigue and are associated with obesity, breast, and colon cancer. She agrees to continue to start prescription Vitamin D 50,000 IU every week and will follow-up for routine testing of Vitamin D, at least 2-3 times per year to avoid over-replacement.  Start- Vitamin D, Ergocalciferol, (DRISDOL) 1.25 MG (50000 UNIT) CAPS capsule; Take 1 capsule (50,000 Units total) by mouth every 7 (seven) days.  Dispense: 4 capsule; Refill: 0  4. At risk for hyperglycemia [name] was given approximately 23 minutes of counseling today regarding detriments to health with hyperglycemia.  She was advised of symptoms of hyperglycemia.  [name] was instructed to avoid skipping proteins in meals and eating only carb-rich foods.  The patient should be checking FBS, 2 hr PP BS's and also any time they do not feel well- especially with new onset nausea/ vomiting and abdominal pain, excessive thirst or hunger, tachycardia etc.  Importance of regular follow-ups with their PCP or Endocrinologist, in addition to our visits was stressed to patient.  5. Obesity, with current BMI of 37.9  [name] is currently in the action stage of change. As such, her goal is to continue with weight loss efforts. She has agreed to the Category 1 Plan with 8-10 oz of lean protein.   Exercise goals:  As is  Behavioral modification strategies: increasing lean protein intake, decreasing simple carbohydrates, decreasing liquid calories, keeping healthy foods in the home, and planning for success.  [name] has agreed to follow-up with our clinic in 2 weeks. She was informed of the importance of frequent follow-up visits to maximize her success with intensive lifestyle modifications for her multiple health conditions.   Objective:   Blood pressure (!) 82/54, pulse 78, temperature 98.1 F (36.7 C), height 5\' 3"  (1.6 m), weight 214 lb (97.1 kg), SpO2 96 %. Body mass index is 37.91 kg/m.  General: Cooperative,  alert, well developed, in no acute distress. HEENT: Conjunctivae and lids unremarkable. Cardiovascular: Regular rhythm.  Lungs: Normal work of breathing. Neurologic: No focal deficits.   Lab Results  Component Value Date   CREATININE 0.7 07/20/2020   BUN 17 07/20/2020   NA 141 07/20/2020   K 4.3 07/20/2020   CL 101 07/20/2020   CO2 29 (A) 07/20/2020   Lab Results  Component Value Date   ALT 15 07/20/2020   AST 18 07/20/2020   ALKPHOS 82 07/20/2020   BILITOT 0.6 12/28/2010   Lab Results  Component Value Date   HGBA1C 9.7 (H) 11/05/2020   HGBA1C 8.0 07/20/2020   HGBA1C 11.3 (H) 12/29/2010   Lab Results  Component Value Date   INSULIN 26.3 (H) 11/05/2020   Lab Results  Component Value Date   TSH 1.430 11/05/2020   Lab Results  Component Value Date   CHOL 195 07/20/2020   HDL 55 07/20/2020   LDLCALC 117 07/20/2020   TRIG 115 07/20/2020   CHOLHDL 4.8 12/29/2010   Lab Results  Component Value Date   VD25OH  8.3 (L) 11/05/2020   Lab Results  Component Value Date   WBC 5.7 11/05/2020   HGB 12.4 11/05/2020   HCT 36.9 11/05/2020   MCV 84 11/05/2020   PLT 323 11/05/2020    Attestation Statements:   Reviewed by clinician on day of visit: allergies, medications, problem list, medical history, surgical history, family history, social history, and previous encounter notes.  Edmund Hilda, CMA, am acting as transcriptionist for [employer], DO.  I have reviewed the above documentation for accuracy and completeness, and I agree with the above. Carlye Grippe, D.O.  The 21st Century Cures Act was signed into law in 2016 which includes the topic of electronic health records.  This provides immediate access to information in MyChart.  This includes consultation notes, operative notes, office notes, lab results and pathology reports.  If you have any questions about what you read please let us know at your next visit so we can discuss your concerns and take  corrective action if need be.  We are right here with you.

## 2020-11-21 DIAGNOSIS — E1169 Type 2 diabetes mellitus with other specified complication: Secondary | ICD-10-CM | POA: Diagnosis not present

## 2020-12-03 ENCOUNTER — Ambulatory Visit (INDEPENDENT_AMBULATORY_CARE_PROVIDER_SITE_OTHER): Payer: BC Managed Care – PPO | Admitting: Family Medicine

## 2020-12-09 ENCOUNTER — Other Ambulatory Visit (INDEPENDENT_AMBULATORY_CARE_PROVIDER_SITE_OTHER): Payer: Self-pay | Admitting: Family Medicine

## 2020-12-09 DIAGNOSIS — E559 Vitamin D deficiency, unspecified: Secondary | ICD-10-CM

## 2020-12-10 NOTE — Telephone Encounter (Signed)
Last OV with Dr Opalski 

## 2021-01-01 ENCOUNTER — Ambulatory Visit (INDEPENDENT_AMBULATORY_CARE_PROVIDER_SITE_OTHER): Payer: BC Managed Care – PPO | Admitting: Family Medicine

## 2021-01-01 ENCOUNTER — Encounter (INDEPENDENT_AMBULATORY_CARE_PROVIDER_SITE_OTHER): Payer: Self-pay

## 2021-01-01 DIAGNOSIS — Z5329 Procedure and treatment not carried out because of patient's decision for other reasons: Secondary | ICD-10-CM

## 2021-01-29 ENCOUNTER — Ambulatory Visit: Payer: BC Managed Care – PPO | Admitting: Cardiology

## 2021-02-22 ENCOUNTER — Encounter: Payer: Self-pay | Admitting: Cardiology

## 2021-02-22 ENCOUNTER — Ambulatory Visit: Payer: BC Managed Care – PPO | Admitting: Cardiology

## 2021-02-22 ENCOUNTER — Other Ambulatory Visit: Payer: Self-pay

## 2021-02-22 VITALS — BP 127/74 | HR 81 | Resp 16 | Ht 63.0 in | Wt 230.6 lb

## 2021-02-22 DIAGNOSIS — E119 Type 2 diabetes mellitus without complications: Secondary | ICD-10-CM | POA: Diagnosis not present

## 2021-02-22 DIAGNOSIS — I10 Essential (primary) hypertension: Secondary | ICD-10-CM | POA: Diagnosis not present

## 2021-02-22 DIAGNOSIS — E66813 Obesity, class 3: Secondary | ICD-10-CM

## 2021-02-22 DIAGNOSIS — E7800 Pure hypercholesterolemia, unspecified: Secondary | ICD-10-CM

## 2021-02-22 DIAGNOSIS — Z8673 Personal history of transient ischemic attack (TIA), and cerebral infarction without residual deficits: Secondary | ICD-10-CM | POA: Diagnosis not present

## 2021-02-22 NOTE — Progress Notes (Signed)
Date:  02/22/2021   ID:  [name], DOB 1960/12/12, MRN [id number]  PCP:  Renaye Rakers, MD  Cardiologist:  Tessa Lerner, DO, [facility] (established care 11/08/2019)  Date: 02/22/21 Last Office Visit: 01/2020   Chief Complaint  Patient presents with   Hypertension   Follow-up    HPI  [name] is a 60 y.o. female who presents to the office with a chief complaint of " 1 year follow-up." Patient's past medical history and cardiovascular risk factors include: Hypertension, hyperlipidemia,  transient ischemic attack, non-insulin-dependent diabetes mellitus type 2, obesity due to excess calories.  Patient was referred to the office back in July 2021 for preoperative risk stratification at the request of her PCP.  Initially referred to the office for preoperative risk ratification for undergoing a right shoulder scope with manipulation under general anesthesia with scalene block.  Due to her reduced functional status she underwent an ischemic evaluation.  Stress test was noted to have reversible ischemia and underwent left heart catheterization which noted no significant epicardial coronary disease.  Patient was recommended to follow-up on annual basis given her age and cardiovascular risk factors.  Patient informed that she did not undergo the right shoulder procedure as her range of motion had returned back to baseline.  In addition, over the last 1 year she remains asymptomatic from a cardiovascular standpoint.  No use of sublingual nitroglycerin tablets.  And overall functional status remains relatively stable.   FUNCTIONAL STATUS: No structured exercise program or daily routine.    ALLERGIES: No Known Allergies  MEDICATION LIST PRIOR TO VISIT: Current Meds  Medication Sig   amLODipine (NORVASC) 10 MG tablet Take 10 mg by mouth daily.   aspirin EC 325 MG tablet Take 325 mg by mouth daily. Swallow whole.    diclofenac Sodium (VOLTAREN) 1 % GEL Apply 1 application topically daily  as needed (pain).    ELDERBERRY PO Take 1 capsule by mouth daily.   irbesartan-hydrochlorothiazide (AVALIDE) 300-12.5 MG tablet Take 1 tablet by mouth daily.   nitroGLYCERIN (NITROSTAT) 0.4 MG SL tablet Place 1 tablet (0.4 mg total) under the tongue every 5 (five) minutes as needed for chest pain. If you require more than two tablets five minutes apart go to the nearest ER via EMS.   OZEMPIC, 1 MG/DOSE, 2 MG/1.5ML SOPN Inject 2 mg into the skin every Tuesday.    Potassium Chloride ER 20 MEQ TBCR Take 20 mEq by mouth daily.    rosuvastatin (CRESTOR) 20 MG tablet Take 20 mg by mouth daily.   SYNJARDY XR 25-1000 MG TB24 Take 1 tablet by mouth daily.   traMADol (ULTRAM) 50 MG tablet Take 50 mg by mouth daily as needed for moderate pain.    Vitamin D, Ergocalciferol, (DRISDOL) 1.25 MG (50000 UNIT) CAPS capsule Take 1 capsule (50,000 Units total) by mouth every 7 (seven) days.     PAST MEDICAL HISTORY: Past Medical History:  Diagnosis Date   Chest pain    Diabetes mellitus without complication (HCC)    Hyperlipidemia    Hypertension    Joint pain    Obesity    TIA (transient ischemic attack)    Vitamin D deficiency     PAST SURGICAL HISTORY: Past Surgical History:  Procedure Laterality Date   ABDOMINAL HYSTERECTOMY     LEFT HEART CATH AND CORONARY ANGIOGRAPHY N/A 01/24/2020   Procedure: LEFT HEART CATH AND CORONARY ANGIOGRAPHY;  Surgeon: Elder Negus, MD;  Location: [facility] INVASIVE [facility];  Service: Cardiovascular;  Laterality: N/A;    FAMILY HISTORY: The patient family history includes Diabetes in her mother; Hypertension in her mother.  SOCIAL HISTORY:  The patient  reports that she has never smoked. She has never used smokeless tobacco. She reports that she does not drink alcohol and does not use drugs.  REVIEW OF SYSTEMS: Review of Systems  Constitutional: Negative for chills and fever.  HENT:  Negative for hoarse voice and nosebleeds.   Eyes:  Negative for discharge,  double vision and pain.  Cardiovascular:  Negative for chest pain, claudication, dyspnea on exertion, leg swelling, near-syncope, orthopnea, palpitations, paroxysmal nocturnal dyspnea and syncope.  Respiratory:  Negative for hemoptysis and shortness of breath.   Musculoskeletal:  Negative for muscle cramps and myalgias.  Gastrointestinal:  Negative for abdominal pain, constipation, diarrhea, hematemesis, hematochezia, melena, nausea and vomiting.  Neurological:  Negative for dizziness and light-headedness.   PHYSICAL EXAM: Vitals with BMI 02/22/2021 11/15/2020 11/01/2020  Height 5\' 3"  5\' 3"  5\' 3"   Weight 230 lbs 10 oz 214 lbs 224 lbs  BMI 40.86 37.92 39.69  Systolic 127 82 114  Diastolic 74 54 74  Pulse 81 78 70  Some encounter information is confidential and restricted. Go to Review Flowsheets activity to see all data.   CONSTITUTIONAL: Well-developed and well-nourished. No acute distress.  SKIN: Skin is warm and dry. No rash noted. No cyanosis. No pallor. No jaundice HEAD: Normocephalic and atraumatic.  EYES: No scleral icterus MOUTH/THROAT: Moist oral membranes.  NECK: No JVD present. No thyromegaly noted. No carotid bruits  LYMPHATIC: No visible cervical adenopathy.  CHEST Normal respiratory effort. No intercostal retractions  LUNGS: Clear to auscultation bilaterally.  No stridor. No wheezes. No rales.  CARDIOVASCULAR: Regular rate and rhythm, positive S1-S2, no murmurs rubs or gallops appreciated. ABDOMINAL: Obese, soft, nondistended, positive bowel sounds in all 4 quadrants.  No apparent ascites.  EXTREMITIES: No peripheral edema  HEMATOLOGIC: No significant bruising NEUROLOGIC: Oriented to person, place, and time. Nonfocal. Normal muscle tone.  PSYCHIATRIC: Normal mood and affect. Normal behavior. Cooperative  CARDIAC DATABASE: EKG: 02/22/2021: NSR, 83 bpm, nonspecific T wave abnormality.    Echocardiogram: 11/18/2019:  Normal LV systolic function with visual EF 60-65%.  Left ventricle cavity is normal in size. Normal global wall motion. Mild left ventricular hypertrophy. Normal diastolic filling pattern, normal LAP. Calculated EF 68%.  Mild tricuspid regurgitation. No evidence of pulmonary hypertension.  No other significant valvular abnormalities.  No prior study for comparison.  Stress Testing: Exercise Sestamibi Stress Test 12/12/2019: Normal ECG stress. The patient exercised for 4 minutes and 59 seconds of a Bruce protocol, achieving approximately 7.01 METs. Exercise was terminated due to fatigue/weakness.  Exercise capacity is reduced with accelerated HR response. Normal BP response.   Moderate degree medium extent fixed perfusion defect located in the  inferior wall of left ventricle most probably represents breast tissue attenuation. Minimal ischemic in this region cannot be excluded.  Gated SPECT imaging of the left ventricle was normal. All segments of left ventricle demonstrated normal wall motion and thickening. No stress lung uptake. TID is normal. Stress LV EF is normal 64%.  Low risk. Comparison 12/30/2010: Lexiscan stress: Normal perfusion.   Heart Catheterization: 01/24/2020 Dr. Evlyn Clines Patwardhan: Right dominant circulation. No coronary artery disease. Normal LVEDP.   LABORATORY DATA: No recent labs for review.  IMPRESSION:    ICD-10-CM   1. Essential hypertension  I10 EKG 12-Lead    2. Non-insulin dependent type 2 diabetes mellitus (HCC)  E11.9     3. Hx-TIA (transient ischemic attack)  Z86.73     4. Hypercholesterolemia  E78.00     5. Class 3 severe obesity due to excess calories with serious comorbidity and body mass index (BMI) of 40.0 to 44.9 in adult [facility])  E66.01    Z68.41        RECOMMENDATIONS: DOYLE TEGETHOFF is a 60 y.o. female whose past medical history and cardiac risk factors include: Hypertension, hyperlipidemia,  transient ischemic attack, non-insulin-dependent diabetes mellitus type 2, obesity due to excess  calories.  Patient is doing well from a cardiovascular standpoint.  Today was supposed to be a 1 year follow-up visit after a right shoulder procedure.  However, for reasons mentioned above should not undergo surgery.  From a cardiovascular standpoint no additional cardiovascular testing.  Patient's blood pressure is well controlled at today's office visit.  Medications reconciled.  Educated on the importance of improving her modifiable cardiovascular risk factors.  Recommended follow-up on as needed basis; however, the shared decision was to follow-up annually or sooner if change in clinical status.  FINAL MEDICATION LIST END OF ENCOUNTER: No orders of the defined types were placed in this encounter.    Current Outpatient Medications:    amLODipine (NORVASC) 10 MG tablet, Take 10 mg by mouth daily., Disp: , Rfl:    aspirin EC 325 MG tablet, Take 325 mg by mouth daily. Swallow whole. , Disp: , Rfl:    diclofenac Sodium (VOLTAREN) 1 % GEL, Apply 1 application topically daily as needed (pain). , Disp: , Rfl:    ELDERBERRY PO, Take 1 capsule by mouth daily., Disp: , Rfl:    irbesartan-hydrochlorothiazide (AVALIDE) 300-12.5 MG tablet, Take 1 tablet by mouth daily., Disp: , Rfl:    nitroGLYCERIN (NITROSTAT) 0.4 MG SL tablet, Place 1 tablet (0.4 mg total) under the tongue every 5 (five) minutes as needed for chest pain. If you require more than two tablets five minutes apart go to the nearest ER via EMS., Disp: 30 tablet, Rfl: 0   OZEMPIC, 1 MG/DOSE, 2 MG/1.5ML SOPN, Inject 2 mg into the skin every Tuesday. , Disp: , Rfl:    Potassium Chloride ER 20 MEQ TBCR, Take 20 mEq by mouth daily. , Disp: , Rfl:    rosuvastatin (CRESTOR) 20 MG tablet, Take 20 mg by mouth daily., Disp: , Rfl:    SYNJARDY XR 25-1000 MG TB24, Take 1 tablet by mouth daily., Disp: , Rfl:    traMADol (ULTRAM) 50 MG tablet, Take 50 mg by mouth daily as needed for moderate pain. , Disp: , Rfl:    Vitamin D, Ergocalciferol, (DRISDOL)  1.25 MG (50000 UNIT) CAPS capsule, Take 1 capsule (50,000 Units total) by mouth every 7 (seven) days., Disp: 4 capsule, Rfl: 0  Orders Placed This Encounter  Procedures   EKG 12-Lead     There are no Patient Instructions on file for this visit.   --Continue cardiac medications as reconciled in final medication list. --Return in about 1 year (around 02/22/2022), or Annual follow-up visit.. Or sooner if needed. --Continue follow-up with your primary care physician regarding the management of your other chronic comorbid conditions.  Patient's questions and concerns were addressed to her satisfaction. She voices understanding of the instructions provided during this encounter.   This note was created using a voice recognition software as a result there may be grammatical errors inadvertently enclosed that do not reflect the nature of this encounter. Every attempt is made to correct  such errors.  Total time spent: 20 minutes.  Tessa Lerner, [place], [facility]  Pager: [phone] Office: [phone]

## 2021-02-24 ENCOUNTER — Encounter: Payer: Self-pay | Admitting: Cardiology

## 2021-03-21 DIAGNOSIS — I10 Essential (primary) hypertension: Secondary | ICD-10-CM | POA: Diagnosis not present

## 2021-03-21 DIAGNOSIS — E1129 Type 2 diabetes mellitus with other diabetic kidney complication: Secondary | ICD-10-CM | POA: Diagnosis not present

## 2021-03-21 DIAGNOSIS — R635 Abnormal weight gain: Secondary | ICD-10-CM | POA: Diagnosis not present

## 2021-03-21 DIAGNOSIS — E1169 Type 2 diabetes mellitus with other specified complication: Secondary | ICD-10-CM | POA: Diagnosis not present

## 2021-03-21 DIAGNOSIS — E669 Obesity, unspecified: Secondary | ICD-10-CM | POA: Diagnosis not present

## 2021-03-21 DIAGNOSIS — E1149 Type 2 diabetes mellitus with other diabetic neurological complication: Secondary | ICD-10-CM | POA: Diagnosis not present

## 2021-03-21 DIAGNOSIS — E782 Mixed hyperlipidemia: Secondary | ICD-10-CM | POA: Diagnosis not present

## 2021-04-03 ENCOUNTER — Ambulatory Visit
Admission: RE | Admit: 2021-04-03 | Discharge: 2021-04-03 | Disposition: A | Discharge status: Home / Self Care | Payer: BC Managed Care – PPO | Source: Ambulatory Visit | Attending: Family Medicine | Admitting: Family Medicine

## 2021-04-03 ENCOUNTER — Other Ambulatory Visit: Payer: Self-pay | Admitting: Family Medicine

## 2021-04-03 DIAGNOSIS — Z1231 Encounter for screening mammogram for malignant neoplasm of breast: Secondary | ICD-10-CM | POA: Diagnosis not present

## 2021-08-15 IMAGING — MG DIGITAL SCREENING BILAT W/ TOMO W/ CAD
6 of 12 series · 6 of 36 positions shown · non-contrast
Comparison: Previous exam(s).

CLINICAL DATA: Screening.

EXAM:
DIGITAL SCREENING BILATERAL MAMMOGRAM WITH TOMO AND CAD

[L MLO synth-2D (1 of 2)]
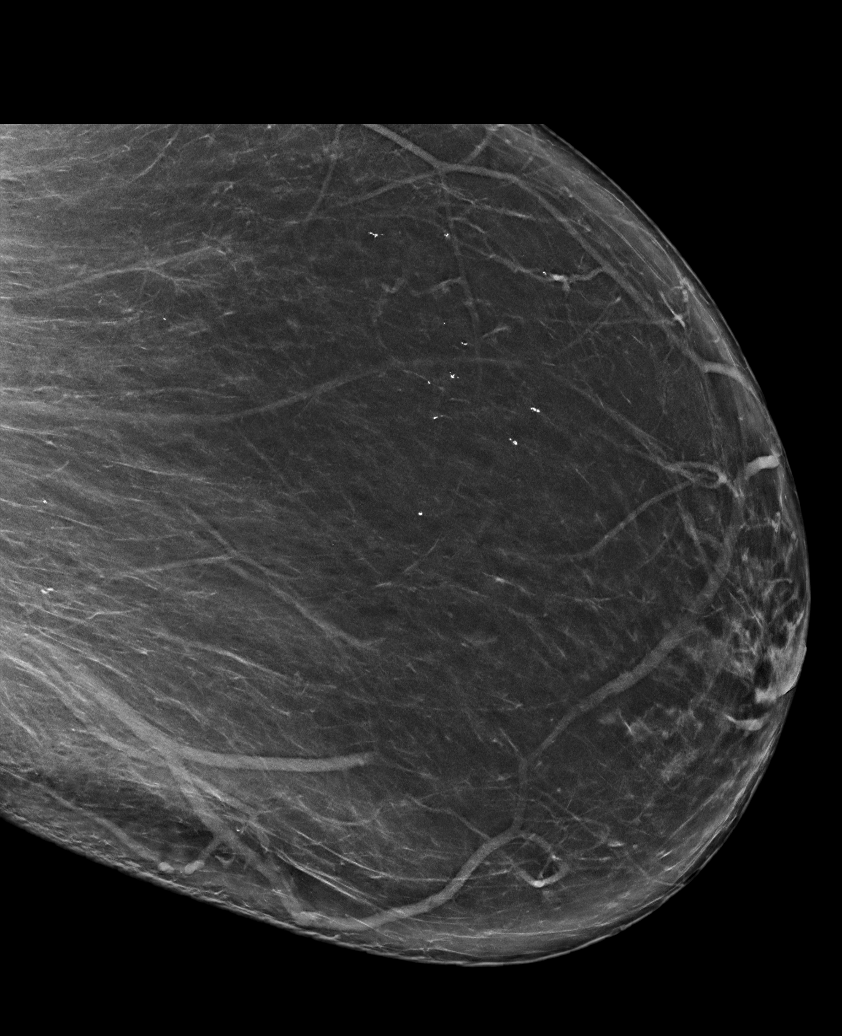

[L CC synth-2D]
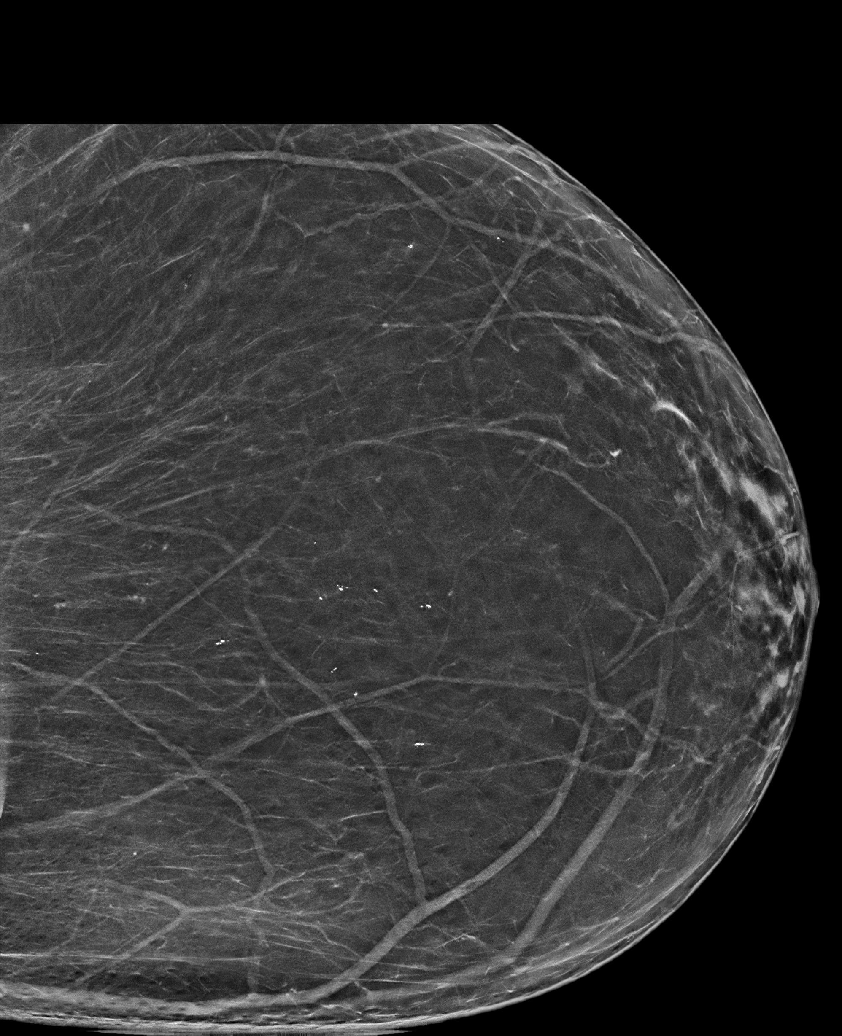

[R MLO synth-2D]
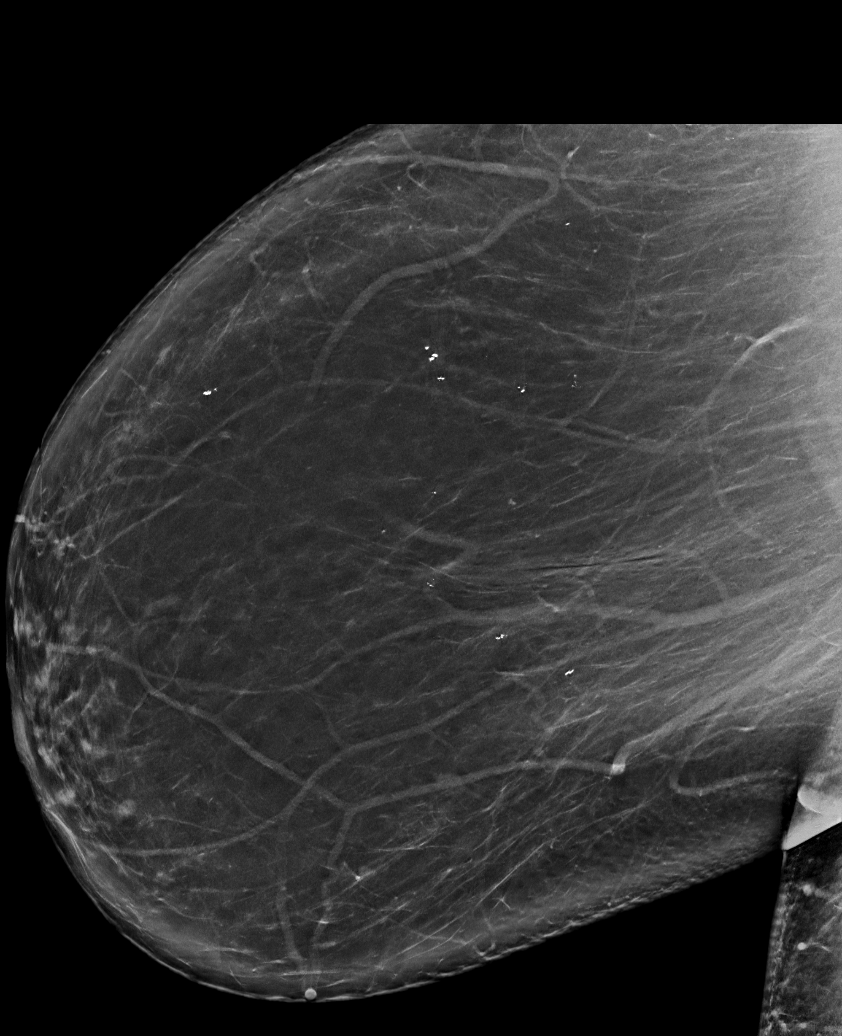

[L MLO synth-2D (2 of 2)]
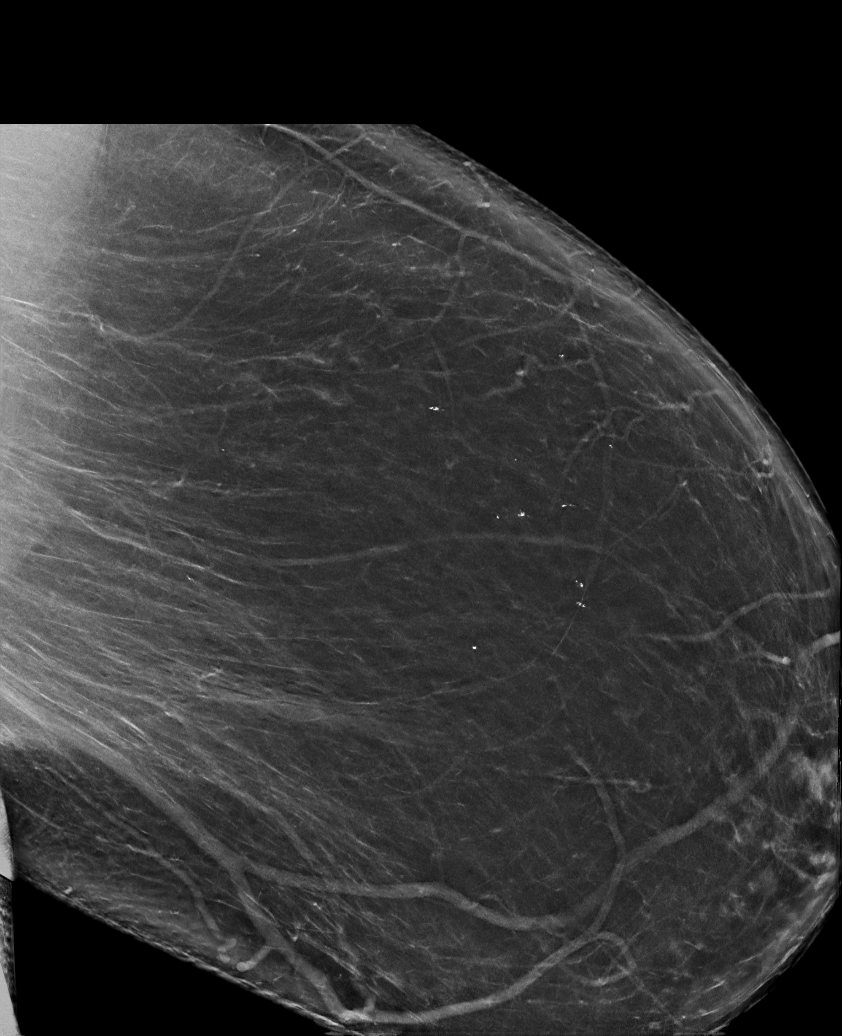

[R CV synth-2D]
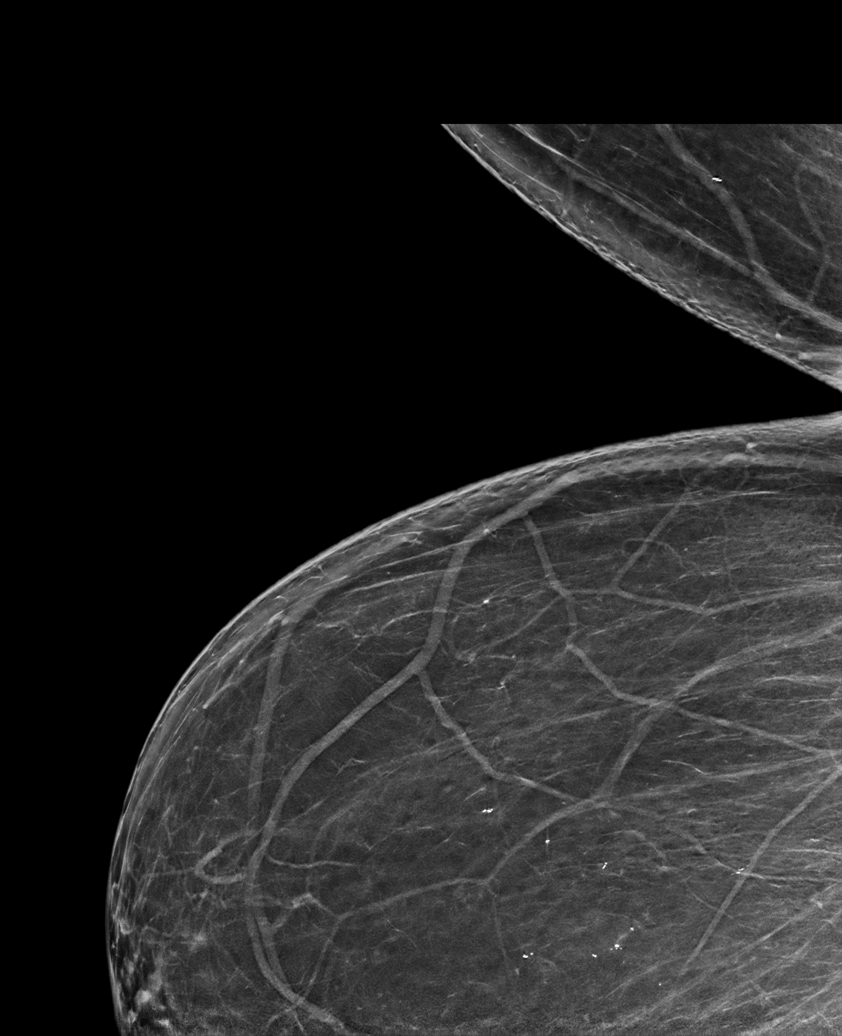

[R CC synth-2D]
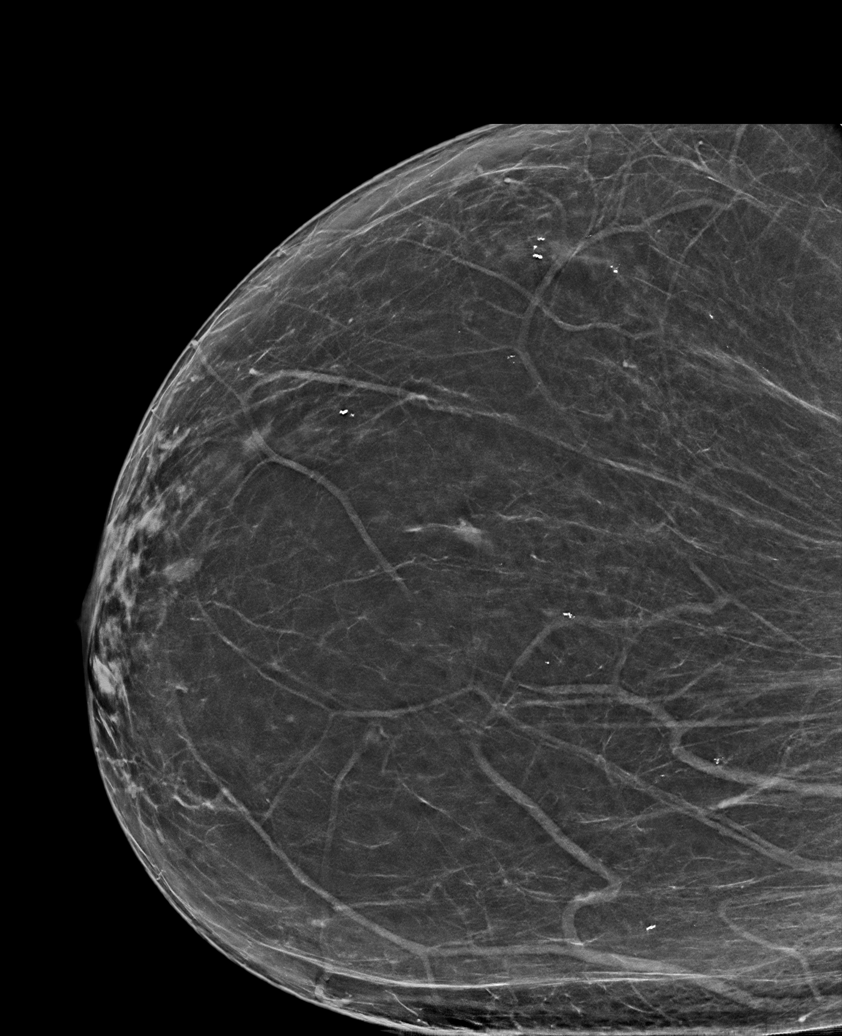

[6 of 36 positions shown; findings below may reference images not displayed]

ACR Breast Density Category b: There are scattered areas of
fibroglandular density.
FINDINGS: There are no findings suspicious for malignancy. Images were
processed with CAD.
IMPRESSION: No mammographic evidence of malignancy. A result letter of this
screening mammogram will be mailed directly to the patient.

RECOMMENDATION:
Screening mammogram in one year. (Code:[id number])

BI-RADS CATEGORY  1: Negative.

## 2021-11-20 ENCOUNTER — Encounter (INDEPENDENT_AMBULATORY_CARE_PROVIDER_SITE_OTHER): Payer: Self-pay

## 2021-12-03 ENCOUNTER — Other Ambulatory Visit (INDEPENDENT_AMBULATORY_CARE_PROVIDER_SITE_OTHER): Payer: Self-pay | Admitting: Family Medicine

## 2021-12-03 DIAGNOSIS — E559 Vitamin D deficiency, unspecified: Secondary | ICD-10-CM

## 2022-02-18 ENCOUNTER — Other Ambulatory Visit: Payer: Self-pay | Admitting: Family Medicine

## 2022-02-18 DIAGNOSIS — Z1231 Encounter for screening mammogram for malignant neoplasm of breast: Secondary | ICD-10-CM

## 2022-02-20 ENCOUNTER — Ambulatory Visit: Payer: BC Managed Care – PPO | Admitting: Cardiology

## 2022-03-04 ENCOUNTER — Other Ambulatory Visit: Payer: Self-pay | Admitting: Family Medicine

## 2022-03-04 DIAGNOSIS — I739 Peripheral vascular disease, unspecified: Secondary | ICD-10-CM

## 2022-03-13 ENCOUNTER — Ambulatory Visit: Payer: BC Managed Care – PPO | Admitting: Cardiology

## 2022-03-17 ENCOUNTER — Other Ambulatory Visit: Payer: BC Managed Care – PPO

## 2022-03-20 ENCOUNTER — Ambulatory Visit: Payer: BC Managed Care – PPO | Admitting: Cardiology

## 2022-03-25 ENCOUNTER — Ambulatory Visit
Admission: RE | Admit: 2022-03-25 | Discharge: 2022-03-25 | Disposition: A | Discharge status: Home / Self Care | Payer: BC Managed Care – PPO | Source: Ambulatory Visit | Attending: Family Medicine | Admitting: Family Medicine

## 2022-03-25 DIAGNOSIS — I739 Peripheral vascular disease, unspecified: Secondary | ICD-10-CM

## 2022-03-31 ENCOUNTER — Encounter: Payer: Self-pay | Admitting: Cardiology

## 2022-03-31 ENCOUNTER — Ambulatory Visit: Payer: BC Managed Care – PPO | Admitting: Cardiology

## 2022-03-31 VITALS — BP 130/78 | HR 72 | Resp 18 | Ht 63.0 in | Wt 225.8 lb

## 2022-03-31 DIAGNOSIS — E7800 Pure hypercholesterolemia, unspecified: Secondary | ICD-10-CM

## 2022-03-31 DIAGNOSIS — I10 Essential (primary) hypertension: Secondary | ICD-10-CM

## 2022-03-31 DIAGNOSIS — Z8673 Personal history of transient ischemic attack (TIA), and cerebral infarction without residual deficits: Secondary | ICD-10-CM

## 2022-03-31 DIAGNOSIS — E119 Type 2 diabetes mellitus without complications: Secondary | ICD-10-CM

## 2022-03-31 NOTE — Progress Notes (Signed)
Date:  02/22/2021   ID:  [name], DOB December 24, 1960, MRN [id number]  PCP:  Renaye Rakers, MD  Cardiologist:  Tessa Lerner, DO, [facility] (established care 11/08/2019)  Date: 03/31/22 Last Office Visit: 02/22/2021   Chief Complaint  Patient presents with   Follow-up    1 year     HPI  [name] is a 61 y.o. female whose past medical history and cardiovascular risk factors include: Hypertension, hyperlipidemia,  transient ischemic attack, non-insulin-dependent diabetes mellitus type 2, obesity due to excess calories.  Patient was initially referred to the practice in July 2021 for preoperative risk ratification prior to her right shoulder scope. At that time due to her reduced functional capacity she did undergo ischemic workup including an echo and stress test.  Stress test was concerning for possible reversible ischemia which prompted angiography which noted no significant epicardial coronary disease.  Patient was requested to follow-up on as-needed basis but preferred annual follow-up visits.  Over the last 1 year she is doing well from a cardiovascular standpoint.  Her blood pressures are well-controlled.  She has implemented lifestyle changes and has lost 5 pounds for which she is congratulated for at today's visit.  She recently had labs with PCP (will request records).  Otherwise she denies anginal discomfort or heart failure symptoms.  FUNCTIONAL STATUS: No structured exercise program or daily routine.    ALLERGIES: No Known Allergies  MEDICATION LIST PRIOR TO VISIT: Current Meds  Medication Sig   amLODipine (NORVASC) 10 MG tablet Take 10 mg by mouth daily.   aspirin EC 325 MG tablet Take 325 mg by mouth daily. Swallow whole.    diclofenac Sodium (VOLTAREN) 1 % GEL Apply 1 application topically daily as needed (pain).    irbesartan-hydrochlorothiazide (AVALIDE) 300-12.5 MG tablet Take 1 tablet by mouth daily.   nitroGLYCERIN (NITROSTAT) 0.4 MG SL tablet Place 1 tablet (0.4  mg total) under the tongue every 5 (five) minutes as needed for chest pain. If you require more than two tablets five minutes apart go to the nearest ER via EMS.   OZEMPIC, 1 MG/DOSE, 2 MG/1.5ML SOPN Inject 2 mg into the skin every Tuesday.    Potassium Chloride ER 20 MEQ TBCR Take 20 mEq by mouth daily.    rosuvastatin (CRESTOR) 20 MG tablet Take 20 mg by mouth daily.   traMADol (ULTRAM) 50 MG tablet Take 50 mg by mouth daily as needed for moderate pain.      PAST MEDICAL HISTORY: Past Medical History:  Diagnosis Date   Chest pain    Diabetes mellitus without complication (HCC)    Hyperlipidemia    Hypertension    Joint pain    Obesity    TIA (transient ischemic attack)    Vitamin D deficiency     PAST SURGICAL HISTORY: Past Surgical History:  Procedure Laterality Date   ABDOMINAL HYSTERECTOMY     LEFT HEART CATH AND CORONARY ANGIOGRAPHY N/A 01/24/2020   Procedure: LEFT HEART CATH AND CORONARY ANGIOGRAPHY;  Surgeon: Elder Negus, MD;  Location: [facility] INVASIVE CV LAB;  Service: Cardiovascular;  Laterality: N/A;    FAMILY HISTORY: The patient family history includes Colon cancer in her brother; Diabetes in her mother; Hypertension in her mother; Leukemia in her brother.  SOCIAL HISTORY:  The patient  reports that she has never smoked. She has never used smokeless tobacco. She reports that she does not drink alcohol and does not use drugs.  REVIEW OF SYSTEMS: Review of Systems  Constitutional: Negative for chills and fever.  HENT:  Negative for hoarse voice and nosebleeds.   Eyes:  Negative for discharge, double vision and pain.  Cardiovascular:  Negative for chest pain, claudication, dyspnea on exertion, leg swelling, near-syncope, orthopnea, palpitations, paroxysmal nocturnal dyspnea and syncope.  Respiratory:  Negative for hemoptysis and shortness of breath.   Musculoskeletal:  Negative for muscle cramps and myalgias.  Gastrointestinal:  Negative for abdominal pain,  constipation, diarrhea, hematemesis, hematochezia, melena, nausea and vomiting.  Neurological:  Negative for dizziness and light-headedness.    PHYSICAL EXAM:    03/31/2022    3:10 PM 03/31/2022    2:17 PM 02/22/2021    2:14 PM  Vitals with BMI  Height  5\' 3"  5\' 3"   Weight  225 lbs 13 oz 230 lbs 10 oz  BMI  40.01 40.86  Systolic 130 161  Diastolic 78 82 74  Pulse 72 71 81   Physical Exam  Constitutional: No distress.  Age appropriate, hemodynamically stable.   Neck: No JVD present.  Cardiovascular: Normal rate, regular rhythm, S1 normal, S2 normal, intact distal pulses and normal pulses. Exam reveals no gallop, no S3 and no S4.  No murmur heard. Pulmonary/Chest: Effort normal and breath sounds normal. No stridor. She has no wheezes. She has no rales.  Abdominal: Soft. Bowel sounds are normal. She exhibits no distension. There is no abdominal tenderness.  Musculoskeletal:        General: No edema.     Cervical back: Neck supple.  Neurological: She is alert and oriented to person, place, and time. She has intact cranial nerves (2-12).  Skin: Skin is warm and moist.   CARDIAC DATABASE: EKG: 02/22/2021: NSR, 83 bpm, nonspecific T wave abnormality.   03/31/2022: Sinus rhythm, 78 bpm, TWI in inferior leads consider inferior ischemia.  Compared to prior ECG dated 02/22/2021 T wave changes are more prominent.  Echocardiogram: 11/18/2019:  Normal LV systolic function with visual EF 60-65%. Left ventricle cavity is normal in size. Normal global wall motion. Mild left ventricular hypertrophy. Normal diastolic filling pattern, normal LAP. Calculated EF 68%.  Mild tricuspid regurgitation. No evidence of pulmonary hypertension.  No other significant valvular abnormalities.  No prior study for comparison.  Stress Testing: Exercise Sestamibi Stress Test 12/12/2019: Normal ECG stress. The patient exercised for 4 minutes and 59 seconds of a Bruce protocol, achieving approximately 7.01  METs. Exercise was terminated due to fatigue/weakness.  Exercise capacity is reduced with accelerated HR response. Normal BP response.   Moderate degree medium extent fixed perfusion defect located in the  inferior wall of left ventricle most probably represents breast tissue attenuation. Minimal ischemic in this region cannot be excluded.  Gated SPECT imaging of the left ventricle was normal. All segments of left ventricle demonstrated normal wall motion and thickening. No stress lung uptake. TID is normal. Stress LV EF is normal 64%.  Low risk. Comparison 12/30/2010: Lexiscan stress: Normal perfusion.   Heart Catheterization: 01/24/2020 Dr. 01/01/2011 Patwardhan: Right dominant circulation. No coronary artery disease. Normal LVEDP.   LABORATORY DATA: No recent labs for review.  IMPRESSION:    ICD-10-CM   1. Essential hypertension  I10 EKG 12-Lead    2. Non-insulin dependent type 2 diabetes mellitus (HCC)  E11.9     3. Hx-TIA (transient ischemic attack)  Z86.73     4. Hypercholesterolemia  E78.00     5. Class 3 severe obesity due to excess calories with serious comorbidity and body mass index (BMI) of 40.0  to 44.9 in adult [facility])  E66.01    Z68.41        RECOMMENDATIONS: [name] is a 61 y.o. female whose past medical history and cardiac risk factors include: Hypertension, hyperlipidemia,  transient ischemic attack, non-insulin-dependent diabetes mellitus type 2, obesity due to excess calories.  From a cardiovascular standpoint patient is stable clinically.  She has not had any anginal discomfort or heart failure symptoms.  Annual EKG notes sinus rhythm with TWI in inferior leads which are little bit more prominent compared to prior study.  She has had an echocardiogram and angiography results reviewed as part of medical decision making at today's visit.  Patient is asked to increase physical activity as tolerated with a goal of 30 minutes a day 5 days a week.  If she has new  onset of cardiovascular symptoms she is asked to seek medical attention.  Her home blood pressures are well-controlled on current medical therapy.  No changes warranted.  She recently had labs with PCP, will request records.  No additional cardiovascular testing warranted at this time.  Patient prefers to follow-up on an annual basis as opposed to as needed.  I have asked her to bring her annual labs with her at the next office visit for reference/review.  FINAL MEDICATION LIST END OF ENCOUNTER: No orders of the defined types were placed in this encounter.    Current Outpatient Medications:    amLODipine (NORVASC) 10 MG tablet, Take 10 mg by mouth daily., Disp: , Rfl:    aspirin EC 325 MG tablet, Take 325 mg by mouth daily. Swallow whole. , Disp: , Rfl:    diclofenac Sodium (VOLTAREN) 1 % GEL, Apply 1 application topically daily as needed (pain). , Disp: , Rfl:    irbesartan-hydrochlorothiazide (AVALIDE) 300-12.5 MG tablet, Take 1 tablet by mouth daily., Disp: , Rfl:    nitroGLYCERIN (NITROSTAT) 0.4 MG SL tablet, Place 1 tablet (0.4 mg total) under the tongue every 5 (five) minutes as needed for chest pain. If you require more than two tablets five minutes apart go to the nearest ER via EMS., Disp: 30 tablet, Rfl: 0   OZEMPIC, 1 MG/DOSE, 2 MG/1.5ML SOPN, Inject 2 mg into the skin every Tuesday. , Disp: , Rfl:    Potassium Chloride ER 20 MEQ TBCR, Take 20 mEq by mouth daily. , Disp: , Rfl:    rosuvastatin (CRESTOR) 20 MG tablet, Take 20 mg by mouth daily., Disp: , Rfl:    traMADol (ULTRAM) 50 MG tablet, Take 50 mg by mouth daily as needed for moderate pain. , Disp: , Rfl:   Orders Placed This Encounter  Procedures   EKG 12-Lead   There are no Patient Instructions on file for this visit.   --Continue cardiac medications as reconciled in final medication list. --Return in about 1 year (around 04/01/2023) for Annual follow up visit. Or sooner if needed. --Continue follow-up with your  primary care physician regarding the management of your other chronic comorbid conditions.  Patient's questions and concerns were addressed to her satisfaction. She voices understanding of the instructions provided during this encounter.   This note was created using a voice recognition software as a result there may be grammatical errors inadvertently enclosed that do not reflect the nature of this encounter. Every attempt is made to correct such errors.  Tessa Lerner, [place], [facility]  Pager: [phone] Office: [phone]

## 2022-04-22 ENCOUNTER — Ambulatory Visit
Admission: RE | Admit: 2022-04-22 | Discharge: 2022-04-22 | Disposition: A | Discharge status: Home / Self Care | Payer: BC Managed Care – PPO | Source: Ambulatory Visit | Attending: Family Medicine | Admitting: Family Medicine

## 2022-04-22 DIAGNOSIS — Z1231 Encounter for screening mammogram for malignant neoplasm of breast: Secondary | ICD-10-CM

## 2023-01-30 ENCOUNTER — Ambulatory Visit
Admission: RE | Admit: 2023-01-30 | Discharge: 2023-01-30 | Disposition: A | Discharge status: Home / Self Care | Payer: BC Managed Care – PPO | Source: Ambulatory Visit | Attending: Family Medicine | Admitting: Family Medicine

## 2023-01-30 ENCOUNTER — Other Ambulatory Visit: Payer: Self-pay | Admitting: Family Medicine

## 2023-01-30 DIAGNOSIS — M25572 Pain in left ankle and joints of left foot: Secondary | ICD-10-CM

## 2023-04-02 ENCOUNTER — Ambulatory Visit: Payer: BC Managed Care – PPO | Attending: Cardiology | Admitting: Cardiology

## 2023-04-02 ENCOUNTER — Encounter: Payer: Self-pay | Admitting: Cardiology

## 2023-04-02 VITALS — BP 128/80 | HR 69 | Resp 16 | Ht 63.0 in | Wt 212.6 lb

## 2023-04-02 DIAGNOSIS — I10 Essential (primary) hypertension: Secondary | ICD-10-CM

## 2023-04-02 DIAGNOSIS — E7800 Pure hypercholesterolemia, unspecified: Secondary | ICD-10-CM

## 2023-04-02 DIAGNOSIS — Z8673 Personal history of transient ischemic attack (TIA), and cerebral infarction without residual deficits: Secondary | ICD-10-CM | POA: Diagnosis not present

## 2023-04-02 DIAGNOSIS — Z6837 Body mass index (BMI) 37.0-37.9, adult: Secondary | ICD-10-CM

## 2023-04-02 DIAGNOSIS — E119 Type 2 diabetes mellitus without complications: Secondary | ICD-10-CM

## 2023-04-02 DIAGNOSIS — E66812 Obesity, class 2: Secondary | ICD-10-CM

## 2023-04-02 NOTE — Patient Instructions (Signed)
Medication Instructions:  Your physician recommends that you continue on your current medications as directed. Please refer to the Current Medication list given to you today.  *If you need a refill on your cardiac medications before your next appointment, please call your pharmacy*  Lab Work: None ordered today. If you have labs (blood work) drawn today and your tests are completely normal, you will receive your results only by: MyChart Message (if you have MyChart) OR A paper copy in the mail If you have any lab test that is abnormal or we need to change your treatment, we will call you to review the results.  Testing/Procedures: Your physician has requested that you have an echocardiogram prior to your 1 year follow-up with Dr. Odis Hollingshead. Echocardiography is a painless test that uses sound waves to create images of your heart. It provides your doctor with information about the size and shape of your heart and how well your heart's chambers and valves are working. This procedure takes approximately one hour. There are no restrictions for this procedure. Please do NOT wear cologne, perfume, aftershave, or lotions (deodorant is allowed). Please arrive 15 minutes prior to your appointment time.  Please note: We ask at that you not bring children with you during ultrasound (echo/ vascular) testing. Due to room size and safety concerns, children are not allowed in the ultrasound rooms during exams. Our front office staff cannot provide observation of children in our lobby area while testing is being conducted. An adult accompanying a patient to their appointment will only be allowed in the ultrasound room at the discretion of the ultrasound technician under special circumstances. We apologize for any inconvenience.   Follow-Up: At [facility], you and your health needs are our priority.  As part of our continuing mission to provide you with exceptional heart care, we have created designated Provider  Care Teams.  These Care Teams include your primary Cardiologist (physician) and Advanced Practice Providers (APPs -  Physician Assistants and Nurse Practitioners) who all work together to provide you with the care you need, when you need it.  We recommend signing up for the patient portal called "MyChart".  Sign up information is provided on this After Visit Summary.  MyChart is used to connect with patients for Virtual Visits (Telemedicine).  Patients are able to view lab/test results, encounter notes, upcoming appointments, etc.  Non-urgent messages can be sent to your provider as well.   To learn more about what you can do with MyChart, go to [email or web address].    Your next appointment:   1 year(s)  The format for your next appointment:   In Person  Provider:   Tessa Lerner, DO {

## 2023-04-02 NOTE — Progress Notes (Signed)
Cardiology Office Note:  .   Date:  04/02/2023  ID:  [name], DOB 05-31-1960, MRN [id number] PCP:  Renaye Rakers, MD  Former Cardiology Providers: None [facility] Providers Cardiologist:  Tessa Lerner, DO , [facility] (established care July 2021) Electrophysiologist:  None  Click to update primary MD,subspecialty MD or APP then REFRESH:1}    Chief Complaint  Patient presents with   Essential hypertension   Follow-up    History of Present Illness: .   [name] is a 62 y.o. African-American female whose past medical history and cardiovascular risk factors includes: Hypertension, hyperlipidemia, transient ischemic attack, non-insulin-dependent diabetes mellitus type 2, obesity due to excess calories.   Patient establish care in 2021 prior to her surgery.  Due to reduced function capacity she should then undergo echo and stress test.  Stress test was concerning for possible reversible ischemia which prompted angiography.  Her angiography noted no significant epicardial coronary disease and she was recommended to follow-up on an as needed basis.  However, patient preferred annual follow-up visits.  She is here for a 1 year follow-up visit today.  Over the last 1 year she is doing well from a cardiovascular standpoint she denies anginal chest pain or heart failure symptoms.  Review of Systems: .   Review of Systems  Cardiovascular:  Negative for chest pain, claudication, irregular heartbeat, leg swelling, near-syncope, orthopnea, palpitations, paroxysmal nocturnal dyspnea and syncope.  Respiratory:  Negative for shortness of breath.   Hematologic/Lymphatic: Negative for bleeding problem.    Studies Reviewed:   EKG: EKG Interpretation Date/Time:  Thursday April 02 2023 10:07:57 EST Ventricular Rate:  74 Text Interpretation: Sinus rhythm with Premature atrial complexes Minimal voltage criteria for LVH, may be normal variant ( R in aVL ) Possible Anterior infarct , age  undetermined When compared with ECG of 24-Jan-2020 07:47, Premature atrial complexes are now Present Confirmed by Tessa Lerner [phone]) on 04/02/2023 10:19:47 AM  Echocardiogram: August 2021: LVEF is preserved at 60-65%, normal diastolic function, no significant valvular heart disease.  Heart Catheterization: 01/24/2020 Dr. Evlyn Clines Patwardhan: Right dominant circulation. No coronary artery disease. Normal LVEDP.  RADIOLOGY: N/A  Risk Assessment/Calculations:   NA   Labs:       Latest Ref Rng & Units 11/05/2020    4:12 PM 07/20/2020   12:00 AM 01/18/2020    3:11 PM  CBC  WBC 3.4 - 10.8 x10E3/uL 5.7  4.9     7.3   Hemoglobin 11.1 - 15.9 g/dL 62.1  30.8     65.7   Hematocrit 34.0 - 46.6 % 36.9  39     41.6   Platelets 150 - 450 x10E3/uL 323  284     357      This result is from an external source.       Latest Ref Rng & Units 07/20/2020   12:00 AM 01/24/2020    7:17 AM 12/31/2010    5:26 AM  BMP  Glucose 70 - 99 mg/dL  846  962   BUN 4 - 21 17     18  10    Creatinine 0.5 - 1.1 0.7     0.97  0.48   Sodium 137 - 147 141     138  137   Potassium 3.4 - 5.3 4.3     2.7  3.5   Chloride 99 - 108 101     100  102   CO2 13 - 22 29  25  23   Calcium 8.7 - 10.7 10.1     9.7  8.8      This result is from an external source.      Latest Ref Rng & Units 07/20/2020   12:00 AM 01/24/2020    7:17 AM 12/31/2010    5:26 AM  CMP  Glucose 70 - 99 mg/dL  562  130   BUN 4 - 21 17     18  10    Creatinine 0.5 - 1.1 0.7     0.97  0.48   Sodium 137 - 147 141     138  137   Potassium 3.4 - 5.3 4.3     2.7  3.5   Chloride 99 - 108 101     100  102   CO2 13 - 22 29     25  23    Calcium 8.7 - 10.7 10.1     9.7  8.8   Alkaline Phos 25 - 125 82        AST 13 - 35 18        ALT 7 - 35 15           This result is from an external source.    Lab Results  Component Value Date   CHOL 195 07/20/2020   HDL 55 07/20/2020   LDLCALC 117 07/20/2020   TRIG 115 07/20/2020   CHOLHDL 4.8  12/29/2010   No results for input(s): "LIPOA" in the last 8760 hours. No components found for: "NTPROBNP" No results for input(s): "PROBNP" in the last 8760 hours. No results for input(s): "TSH" in the last 8760 hours.   Physical Exam:    Today's Vitals   04/02/23 1005  BP: 128/80  Pulse: 69  Resp: 16  SpO2: 94%  Weight: 212 lb 9.6 oz (96.4 kg)  Height: 5\' 3"  (1.6 m)   Body mass index is 37.66 kg/m. Wt Readings from Last 3 Encounters:  04/02/23 212 lb 9.6 oz (96.4 kg)  03/31/22 225 lb 12.8 oz (102.4 kg)  02/22/21 230 lb 9.6 oz (104.6 kg)    Physical Exam  Constitutional: No distress.  hemodynamically stable  Neck: No JVD present.  Cardiovascular: Normal rate, regular rhythm, S1 normal and S2 normal. Exam reveals no gallop, no S3 and no S4.  No murmur heard. Pulmonary/Chest: Effort normal and breath sounds normal. No stridor. She has no wheezes. She has no rales.  Abdominal: Soft. Bowel sounds are normal. She exhibits no distension. There is no abdominal tenderness.  Musculoskeletal:        General: No edema.     Cervical back: Neck supple.  Neurological: She is alert and oriented to person, place, and time. She has intact cranial nerves (2-12).  Skin: Skin is warm.     Impression & Recommendation(s):  Impression:   ICD-10-CM   1. Essential hypertension  I10 EKG 12-Lead    ECHOCARDIOGRAM COMPLETE    2. Non-insulin dependent type 2 diabetes mellitus (HCC)  E11.9     3. Hx-TIA (transient ischemic attack)  Z86.73     4. Hypercholesterolemia  E78.00     5. Class 2 severe obesity due to excess calories with serious comorbidity and body mass index (BMI) of 37.0 to 37.9 in adult [facility])  Q65.784    E66.01    Z68.37        Recommendation(s):  Essential hypertension Office blood pressures are very well-controlled. Continue amlodipine 10 mg p.o. daily.  Echo will be ordered to evaluate for structural heart disease and left ventricular systolic  function.  Non-insulin dependent type 2 diabetes mellitus (HCC) Reemphasized importance of glycemic control. He cannot have the most recent labs available for review-checked in Care Everywhere and she also checked on her phone.  She will send [place] a copy when they are updated. Continue Jardiance 25 mg p.o. daily Currently on Ozempic 2 mg q. weekly-has lost approximately 13 pounds since last office visit.  Hx-TIA (transient ischemic attack) Remains asymptomatic. Reemphasized the importance of secondary prevention with focus on improving her modifiable cardiovascular risk factors such as glycemic control, lipid management, blood pressure control, weight loss.  Hypercholesterolemia Currently on Crestor 20 mg p.o. daily.   She denies myalgia or other side effects. No recent labs available for review-requested labs when they are updated. Currently managed by primary care provider.  Class 2 severe obesity due to excess calories with serious comorbidity and body mass index (BMI) of 37.0 to 37.9 in adult [facility]) Patient has lost approximately 13 pounds in the last office visit-she is congratulated on her efforts and success to the weight loss. Body mass index is 37.66 kg/m. I reviewed with her importance of diet, regular physical activity/exercise, weight loss.   Patient is educated on the importance of increasing physical activity gradually as tolerated with a goal of moderate intensity exercise for 30 minutes a day 5 days a week.  Orders Placed:  Orders Placed This Encounter  Procedures   EKG 12-Lead   ECHOCARDIOGRAM COMPLETE    Standing Status:   Future    Expected Date:   04/01/2024    Expiration Date:   04/01/2024    Where should this test be performed:   [facility])    Does the patient weigh less than or greater than 250 lbs?:   Patient weighs less than 250 lbs    Perflutren DEFINITY (image enhancing agent) should be administered unless hypersensitivity or allergy  exist:   Administer Perflutren    Reason for exam-Echo:   Other-Full Diagnosis List    Full ICD-10/Reason for Exam:   HTN (hypertension) [409811]   Discussed management of at least 2 chronic comorbid conditions, medications reviewed, independently ordered and reviewed EKG, future tests ordered, coordination of care.  Final Medication List:   No orders of the defined types were placed in this encounter.   There are no discontinued medications.   Current Outpatient Medications:    amLODipine (NORVASC) 10 MG tablet, Take 10 mg by mouth daily., Disp: , Rfl:    aspirin EC 325 MG tablet, Take 325 mg by mouth daily. Swallow whole. , Disp: , Rfl:    Cholecalciferol (VITAMIN D3) 1.25 MG (50000 UT) CAPS, Take 1 capsule by mouth once a week., Disp: , Rfl:    diclofenac Sodium (VOLTAREN) 1 % GEL, Apply 1 application topically daily as needed (pain). , Disp: , Rfl:    irbesartan-hydrochlorothiazide (AVALIDE) 300-12.5 MG tablet, Take 1 tablet by mouth daily., Disp: , Rfl:    JARDIANCE 25 MG TABS tablet, Take 25 mg by mouth every morning., Disp: , Rfl:    nitroGLYCERIN (NITROSTAT) 0.4 MG SL tablet, Place 1 tablet (0.4 mg total) under the tongue every 5 (five) minutes as needed for chest pain. If you require more than two tablets five minutes apart go to the nearest ER via EMS., Disp: 30 tablet, Rfl: 0   OZEMPIC, 1 MG/DOSE, 2 MG/1.5ML SOPN, Inject 2 mg into the skin every  Tuesday. , Disp: , Rfl:    Potassium Chloride ER 20 MEQ TBCR, Take 20 mEq by mouth daily. , Disp: , Rfl:    rosuvastatin (CRESTOR) 20 MG tablet, Take 20 mg by mouth daily., Disp: , Rfl:    traMADol (ULTRAM) 50 MG tablet, Take 50 mg by mouth daily as needed for moderate pain. , Disp: , Rfl:   Consent:   N/A  Disposition:   1 year follow-up sooner if needed Patient may be asked to follow-up sooner based on the results of the above-mentioned testing.  Her questions and concerns were addressed to her satisfaction. She voices  understanding of the recommendations provided during this encounter.    Signed, Tessa Lerner, DO, [facility] [place]  [facility]  [address] [place], [place] [address]

## 2023-04-09 ENCOUNTER — Encounter: Payer: Self-pay | Admitting: Cardiology

## 2023-11-06 ENCOUNTER — Other Ambulatory Visit: Payer: Self-pay | Admitting: Family Medicine

## 2023-11-06 DIAGNOSIS — Z1231 Encounter for screening mammogram for malignant neoplasm of breast: Secondary | ICD-10-CM

## 2023-11-24 ENCOUNTER — Ambulatory Visit
Admission: RE | Admit: 2023-11-24 | Discharge: 2023-11-24 | Disposition: A | Discharge status: Home / Self Care | Source: Ambulatory Visit | Attending: Family Medicine | Admitting: Family Medicine

## 2023-11-24 DIAGNOSIS — Z1231 Encounter for screening mammogram for malignant neoplasm of breast: Secondary | ICD-10-CM

## 2024-03-07 ENCOUNTER — Ambulatory Visit (HOSPITAL_COMMUNITY)
Admission: RE | Admit: 2024-03-07 | Discharge: 2024-03-07 | Disposition: A | Discharge status: Home / Self Care | Payer: BC Managed Care – PPO | Source: Ambulatory Visit | Attending: Cardiovascular Disease | Admitting: Cardiovascular Disease

## 2024-03-07 DIAGNOSIS — I10 Essential (primary) hypertension: Secondary | ICD-10-CM | POA: Diagnosis present

## 2024-03-07 LAB — ECHOCARDIOGRAM COMPLETE
Area-P 1/2: 3.87 cm2
S' Lateral: 3.08 cm

## 2024-03-11 ENCOUNTER — Ambulatory Visit: Payer: Self-pay | Admitting: Cardiology

## 2024-03-28 ENCOUNTER — Encounter: Payer: Self-pay | Admitting: Cardiology

## 2024-03-28 ENCOUNTER — Ambulatory Visit: Admitting: Cardiology

## 2024-03-28 VITALS — BP 115/77 | HR 64 | Resp 16 | Ht 63.0 in | Wt 214.0 lb

## 2024-03-28 DIAGNOSIS — E66812 Obesity, class 2: Secondary | ICD-10-CM

## 2024-03-28 DIAGNOSIS — E7800 Pure hypercholesterolemia, unspecified: Secondary | ICD-10-CM

## 2024-03-28 DIAGNOSIS — Z6837 Body mass index (BMI) 37.0-37.9, adult: Secondary | ICD-10-CM

## 2024-03-28 DIAGNOSIS — E119 Type 2 diabetes mellitus without complications: Secondary | ICD-10-CM | POA: Diagnosis not present

## 2024-03-28 DIAGNOSIS — I10 Essential (primary) hypertension: Secondary | ICD-10-CM

## 2024-03-28 DIAGNOSIS — Z8673 Personal history of transient ischemic attack (TIA), and cerebral infarction without residual deficits: Secondary | ICD-10-CM | POA: Diagnosis not present

## 2024-03-28 NOTE — Progress Notes (Signed)
 Cardiology Office Note:  .   Date:  03/28/2024  ID:  [name], DOB 1960/07/26, MRN [id number] PCP:  Benjamine Aland, MD  Former Cardiology Providers: None [facility] Providers Cardiologist:  Cynthia Large, DO , [facility] (established care July 2021) Electrophysiologist:  None  Click to update primary MD,subspecialty MD or APP then REFRESH:1}    Chief Complaint  Patient presents with   Hypertension   Follow-up    History of Present Illness: .   [name] is a 63 y.o. African-American female whose past medical history and cardiovascular risk factors includes: Hypertension, hyperlipidemia, transient ischemic attack, non-insulin -dependent diabetes mellitus type 2, obesity due to excess calories.   Patient established care in 2021 prior to her surgery due to reduced functional capacity she underwent testing. Stress test was concerning for possible reversible ischemia which prompted angiography.  Her angiography noted no significant epicardial coronary disease and she was recommended to follow-up on an as needed basis.  However, patient preferred annual follow-up visits.  At her last yearly visit echocardiogram was ordered and no other medication changes were recommended.  She presents today for her annual follow-up visit.  Since last office visit [name] denies any anginal chest pain or heart failure symptoms.   No hospitalizations or urgent care visits for cardiovascular reasons.   She has been compliant with her medical therapy and endorses no concerns.  No significant change in weight since last visit.  Physical endurance remains stable Home SBP well controlled.    Review of Systems: .   Review of Systems  Cardiovascular:  Negative for chest pain, claudication, irregular heartbeat, leg swelling, near-syncope, orthopnea, palpitations, paroxysmal nocturnal dyspnea and syncope.  Respiratory:  Negative for shortness of breath.   Hematologic/Lymphatic: Negative for bleeding problem.     Studies Reviewed:   EKG: EKG Interpretation Date/Time:  Monday March 28 2024 08:35:34 EST Ventricular Rate:  64 Text Interpretation: Normal sinus rhythm Cannot rule out Anterior infarct (cited on or before 02-Apr-2023) When compared with ECG of 02-Apr-2023 10:07, Premature atrial complexes are no longer Present Nonspecific T wave abnormality, improved in Lateral leads Confirmed by Wiggins Cynthia [phone]) on 03/28/2024 8:57:18 AM  Echocardiogram: August 2021: LVEF is preserved at 60-65%, normal diastolic function, no significant valvular heart disease.  03/07/2024: LVEF 55 to 60%, normal diastolic function, no significant valvular heart disease.  Heart Catheterization: 01/24/2020 Dr. Newman Patwardhan: Right dominant circulation. No coronary artery disease. Normal LVEDP.  RADIOLOGY: N/A  Risk Assessment/Calculations:   NA   Labs:       Latest Ref Rng & Units 11/05/2020    4:12 PM 07/20/2020   12:00 AM 01/18/2020    3:11 PM  CBC  WBC 3.4 - 10.8 x10E3/uL 5.7  4.9     7.3   Hemoglobin 11.1 - 15.9 g/dL 87.5  87.1     85.7   Hematocrit 34.0 - 46.6 % 36.9  39     41.6   Platelets 150 - 450 x10E3/uL 323  284     357      This result is from an external source.       Latest Ref Rng & Units 07/20/2020   12:00 AM 01/24/2020    7:17 AM 12/31/2010    5:26 AM  BMP  Glucose 70 - 99 mg/dL  834  791   BUN 4 - 21 17     18  10    Creatinine 0.5 - 1.1 0.7     0.97  0.48   Sodium 137 - 147 141     138  137   Potassium 3.4 - 5.3 4.3     2.7  3.5   Chloride 99 - 108 101     100  102   CO2 13 - 22 29     25  23    Calcium 8.7 - 10.7 10.1     9.7  8.8      This result is from an external source.      Latest Ref Rng & Units 07/20/2020   12:00 AM 01/24/2020    7:17 AM 12/31/2010    5:26 AM  CMP  Glucose 70 - 99 mg/dL  834  791   BUN 4 - 21 17     18  10    Creatinine 0.5 - 1.1 0.7     0.97  0.48   Sodium 137 - 147 141     138  137   Potassium 3.4 - 5.3 4.3     2.7  3.5   Chloride  99 - 108 101     100  102   CO2 13 - 22 29     25  23    Calcium 8.7 - 10.7 10.1     9.7  8.8   Alkaline Phos 25 - 125 82        AST 13 - 35 18        ALT 7 - 35 15           This result is from an external source.    Lab Results  Component Value Date   CHOL 195 07/20/2020   HDL 55 07/20/2020   LDLCALC 117 07/20/2020   TRIG 115 07/20/2020   CHOLHDL 4.8 12/29/2010   No results for input(s): LIPOA in the last 8760 hours. No components found for: NTPROBNP No results for input(s): PROBNP in the last 8760 hours. No results for input(s): TSH in the last 8760 hours.   Physical Exam:    Today's Vitals   03/28/24 0833  BP: 115/77  Pulse: 64  Resp: 16  SpO2: 95%  Weight: 214 lb (97.1 kg)  Height: 5' 3 (1.6 m)   Body mass index is 37.91 kg/m. Wt Readings from Last 3 Encounters:  03/28/24 214 lb (97.1 kg)  04/02/23 212 lb 9.6 oz (96.4 kg)  03/31/22 225 lb 12.8 oz (102.4 kg)    Physical Exam  Constitutional: No distress.  hemodynamically stable  Neck: No JVD present.  Cardiovascular: Normal rate, regular rhythm, S1 normal and S2 normal. Exam reveals no gallop, no S3 and no S4.  No murmur heard. Pulmonary/Chest: Effort normal and breath sounds normal. No stridor. She has no wheezes. She has no rales.  Abdominal: Soft. Bowel sounds are normal. She exhibits no distension. There is no abdominal tenderness.  Musculoskeletal:        General: No edema.     Cervical back: Neck supple.  Neurological: She is alert and oriented to person, place, and time. She has intact cranial nerves (2-12).  Skin: Skin is warm.     Impression & Recommendation(s):  Impression:   ICD-10-CM   1. Essential hypertension  I10 EKG 12-Lead    2. Non-insulin  dependent type 2 diabetes mellitus (HCC)  E11.9     3. Hypercholesterolemia  E78.00     4. Hx-TIA (transient ischemic attack)  Z86.73     5. Class 2 severe obesity due to excess calories with serious comorbidity  and body mass index  (BMI) of 37.0 to 37.9 in adult  E66.812    Z68.37        Recommendation(s):  Essential hypertension Home blood pressure well-controlled. Continue amlodipine 10 mg p.o. daily. Continue irbesartan/hydrochlorothiazide 300/12.5 mg p.o. daily  Non-insulin  dependent type 2 diabetes mellitus (HCC) Currently on ARB, statin therapy, Ozempic, Jardiance Do not have her most recent A1c. Emphasized on the importance of glycemic control  Hypercholesterolemia Continue Crestor 20 mg p.o. nightly. Recommend a goal LDL <70 mg/dL   Class 2 severe obesity due to excess calories with serious comorbidity and body mass index (BMI) of 37.0 to 37.9 in adult Body mass index is 37.91 kg/m. I reviewed with her importance of diet, regular physical activity/exercise, weight loss.   Patient is educated on the importance of increasing physical activity gradually as tolerated with a goal of moderate intensity exercise for 30 minutes a day 5 days a week.  Patient would like to continue to be seen on an annual basis given her age and comorbid conditions despite recommending as needed follow-ups as she has had a very thorough workup.  Most recent echocardiogram independently reviewed with the patient as well as the EKG from 03/28/2024.  She recently had labs with PCP in November 2025 I will try to get a copy of those her records for review and patient has not been advised to do so as well.  Shared decision was to follow-up annually sooner if needed   Orders Placed:  Orders Placed This Encounter  Procedures   EKG 12-Lead    Final Medication List:   No orders of the defined types were placed in this encounter.   There are no discontinued medications.   Current Outpatient Medications:    amLODipine (NORVASC) 10 MG tablet, Take 10 mg by mouth daily., Disp: , Rfl:    aspirin  EC 325 MG tablet, Take 325 mg by mouth daily. Swallow whole. , Disp: , Rfl:    Cholecalciferol (VITAMIN D3) 1.25 MG (50000 UT) CAPS, Take 1  capsule by mouth once a week., Disp: , Rfl:    diclofenac Sodium (VOLTAREN) 1 % GEL, Apply 1 application topically daily as needed (pain). , Disp: , Rfl:    irbesartan-hydrochlorothiazide (AVALIDE) 300-12.5 MG tablet, Take 1 tablet by mouth daily., Disp: , Rfl:    JARDIANCE 25 MG TABS tablet, Take 25 mg by mouth every morning., Disp: , Rfl:    nitroGLYCERIN  (NITROSTAT ) 0.4 MG SL tablet, Place 1 tablet (0.4 mg total) under the tongue every 5 (five) minutes as needed for chest pain. If you require more than two tablets five minutes apart go to the nearest ER via EMS., Disp: 30 tablet, Rfl: 0   OZEMPIC, 1 MG/DOSE, 2 MG/1.5ML SOPN, Inject 2 mg into the skin every Tuesday. , Disp: , Rfl:    Potassium Chloride  ER 20 MEQ TBCR, Take 20 mEq by mouth daily. , Disp: , Rfl:    rosuvastatin (CRESTOR) 20 MG tablet, Take 20 mg by mouth daily., Disp: , Rfl:    traMADol (ULTRAM) 50 MG tablet, Take 50 mg by mouth daily as needed for moderate pain. , Disp: , Rfl:   Consent:   N/A  Disposition:   1 year follow up.   Her questions and concerns were addressed to her satisfaction. She voices understanding of the recommendations provided during this encounter.    Signed, Cynthia Large, DO, [facility]  A Division of [facility] [address]  [address] [place], [place] [address]

## 2024-03-28 NOTE — Patient Instructions (Signed)
 Medication Instructions:  Your physician recommends that you continue on your current medications as directed. Please refer to the Current Medication list given to you today.  *If you need a refill on your cardiac medications before your next appointment, please call your pharmacy*  Lab Work: We will request your most recent labs from your Primary Care If you have labs (blood work) drawn today and your tests are completely normal, you will receive your results only by: MyChart Message (if you have MyChart) OR A paper copy in the mail If you have any lab test that is abnormal or we need to change your treatment, we will call you to review the results.  Testing/Procedures: None ordered  Follow-Up: At [facility], you and your health needs are our priority.  As part of our continuing mission to provide you with exceptional heart care, our providers are all part of one team.  This team includes your primary Cardiologist (physician) and Advanced Practice Providers or APPs (Physician Assistants and Nurse Practitioners) who all work together to provide you with the care you need, when you need it.  Your next appointment:   1 year(s)  Provider:   Madonna Large, DO    We recommend signing up for the patient portal called MyChart.  Sign up information is provided on this After Visit Summary.  MyChart is used to connect with patients for Virtual Visits (Telemedicine).  Patients are able to view lab/test results, encounter notes, upcoming appointments, etc.  Non-urgent messages can be sent to your provider as well.   To learn more about what you can do with MyChart, go to [email or web address].
